# Patient Record
Sex: Male | Born: 1955 | Hispanic: Yes | Marital: Married | Smoking: Former smoker
Health system: Southern US, Community
[De-identification: ages and names within clinical notes are randomized; demographics above are authoritative.]

## PROBLEM LIST (undated history)

## (undated) DIAGNOSIS — M79671 Pain in right foot: Secondary | ICD-10-CM

## (undated) DIAGNOSIS — E559 Vitamin D deficiency, unspecified: Secondary | ICD-10-CM

## (undated) DIAGNOSIS — I639 Cerebral infarction, unspecified: Secondary | ICD-10-CM

## (undated) DIAGNOSIS — M25561 Pain in right knee: Secondary | ICD-10-CM

## (undated) DIAGNOSIS — R197 Diarrhea, unspecified: Secondary | ICD-10-CM

## (undated) DIAGNOSIS — N529 Male erectile dysfunction, unspecified: Secondary | ICD-10-CM

## (undated) DIAGNOSIS — I447 Left bundle-branch block, unspecified: Secondary | ICD-10-CM

## (undated) DIAGNOSIS — E291 Testicular hypofunction: Secondary | ICD-10-CM

## (undated) DIAGNOSIS — M1711 Unilateral primary osteoarthritis, right knee: Secondary | ICD-10-CM

## (undated) DIAGNOSIS — R0602 Shortness of breath: Secondary | ICD-10-CM

## (undated) DIAGNOSIS — E782 Mixed hyperlipidemia: Secondary | ICD-10-CM

## (undated) DIAGNOSIS — R931 Abnormal findings on diagnostic imaging of heart and coronary circulation: Secondary | ICD-10-CM

## (undated) DIAGNOSIS — R42 Dizziness and giddiness: Secondary | ICD-10-CM

## (undated) HISTORY — DX: Pain in right foot: M79.671

## (undated) HISTORY — DX: Vitamin D deficiency, unspecified: E55.9

## (undated) HISTORY — DX: Testicular hypofunction: E29.1

## (undated) HISTORY — DX: Diarrhea, unspecified: R19.7

## (undated) HISTORY — DX: Mixed hyperlipidemia: E78.2

## (undated) HISTORY — DX: Left bundle-branch block, unspecified: I44.7

## (undated) HISTORY — DX: Dizziness and giddiness: R42

## (undated) HISTORY — DX: Male erectile dysfunction, unspecified: N52.9

## (undated) HISTORY — DX: Pain in right knee: M25.561

## (undated) HISTORY — DX: Shortness of breath: R06.02

## (undated) HISTORY — DX: Cerebral infarction, unspecified: I63.9

## (undated) HISTORY — DX: Abnormal findings on diagnostic imaging of heart and coronary circulation: R93.1

## (undated) HISTORY — DX: Unilateral primary osteoarthritis, right knee: M17.11

---

## 2007-03-24 HISTORY — PX: OTHER SURGICAL HISTORY: SHX169

## 2007-03-24 HISTORY — PX: CARPAL TUNNEL RELEASE: SHX101

## 2017-03-23 HISTORY — PX: KIDNEY STONE SURGERY: SHX686

## 2019-06-01 DIAGNOSIS — S83231A Complex tear of medial meniscus, current injury, right knee, initial encounter: Secondary | ICD-10-CM

## 2019-06-07 DIAGNOSIS — E782 Mixed hyperlipidemia: Secondary | ICD-10-CM | POA: Insufficient documentation

## 2019-06-07 DIAGNOSIS — N529 Male erectile dysfunction, unspecified: Secondary | ICD-10-CM | POA: Insufficient documentation

## 2019-06-07 DIAGNOSIS — E291 Testicular hypofunction: Secondary | ICD-10-CM | POA: Insufficient documentation

## 2019-06-07 DIAGNOSIS — E559 Vitamin D deficiency, unspecified: Secondary | ICD-10-CM | POA: Insufficient documentation

## 2019-06-07 DIAGNOSIS — I447 Left bundle-branch block, unspecified: Secondary | ICD-10-CM | POA: Insufficient documentation

## 2019-06-07 DIAGNOSIS — R42 Dizziness and giddiness: Secondary | ICD-10-CM | POA: Insufficient documentation

## 2019-06-07 DIAGNOSIS — M79671 Pain in right foot: Secondary | ICD-10-CM | POA: Insufficient documentation

## 2019-06-07 DIAGNOSIS — M25561 Pain in right knee: Secondary | ICD-10-CM | POA: Insufficient documentation

## 2019-06-07 DIAGNOSIS — R197 Diarrhea, unspecified: Secondary | ICD-10-CM | POA: Insufficient documentation

## 2019-06-07 DIAGNOSIS — M1711 Unilateral primary osteoarthritis, right knee: Secondary | ICD-10-CM | POA: Insufficient documentation

## 2019-06-08 ENCOUNTER — Encounter: Payer: Self-pay | Admitting: Cardiology

## 2019-06-08 ENCOUNTER — Other Ambulatory Visit: Payer: Self-pay

## 2019-06-08 ENCOUNTER — Ambulatory Visit (INDEPENDENT_AMBULATORY_CARE_PROVIDER_SITE_OTHER): Payer: Commercial Managed Care - PPO | Admitting: Cardiology

## 2019-06-08 VITALS — BP 160/100 | HR 86 | Ht 61.0 in | Wt 218.0 lb

## 2019-06-08 DIAGNOSIS — I10 Essential (primary) hypertension: Secondary | ICD-10-CM

## 2019-06-08 DIAGNOSIS — I447 Left bundle-branch block, unspecified: Secondary | ICD-10-CM

## 2019-06-08 DIAGNOSIS — R9431 Abnormal electrocardiogram [ECG] [EKG]: Secondary | ICD-10-CM

## 2019-06-08 DIAGNOSIS — E782 Mixed hyperlipidemia: Secondary | ICD-10-CM | POA: Diagnosis not present

## 2019-06-08 HISTORY — DX: Essential (primary) hypertension: I10

## 2019-06-08 HISTORY — DX: Abnormal electrocardiogram (ECG) (EKG): R94.31

## 2019-06-08 HISTORY — DX: Left bundle-branch block, unspecified: I44.7

## 2019-06-08 HISTORY — DX: Morbid (severe) obesity due to excess calories: E66.01

## 2019-06-08 MED ORDER — HYDROCHLOROTHIAZIDE 12.5 MG PO CAPS: 12.5000 mg | ORAL_CAPSULE | Freq: Every day | ORAL | 1 refills | Status: DC

## 2019-06-08 NOTE — Progress Notes (Signed)
Cardiology Office Note:    Date:  06/08/2019   ID:  Austin Davenport, DOB 06/16/1955, MRN [id number]  PCP:  Hortencia Conradi, NP  Cardiologist:  Thomasene Ripple, DO  Electrophysiologist:  None   Referring MD: Hortencia Conradi, NP   Chief Complaint  Patient presents with  . Pre-op Exam    Rt knee repair    History of Present Illness:    Austin Davenport is a 64 y.o. male with a hx of hypertension, Hyperlipidemia, obesity presents to be evaluated  Preoperatively for left bundle bundle branch block. He denies chest pain, shortness of breath.   He was asked to see cardiology preoperatively.   Past Medical History:  Diagnosis Date  . Diarrhea   . Dizziness   . Erectile dysfunction   . Left bundle branch block   . Mixed hyperlipidemia   . Pain in right foot   . Pain in right knee   . Testicular hypofunction   . Unilateral primary osteoarthritis, right knee   . Vitamin D deficiency     Past Surgical History:  Procedure Laterality Date  . CARPAL TUNNEL RELEASE  2009  . KIDNEY STONE SURGERY  2019  . skin biopsy  2009    Current Medications: Current Meds  Medication Sig  . atorvastatin (LIPITOR) 40 MG tablet Take 1 tablet by mouth at bedtime.  . diclofenac Sodium (VOLTAREN) 1 % GEL Apply 1 application topically 2 (two) times daily.  . meclizine (ANTIVERT) 25 MG tablet Take 2 tablets by mouth daily as needed.  . meloxicam (MOBIC) 15 MG tablet Take 1 tablet by mouth daily.  . sildenafil (VIAGRA) 100 MG tablet Take 1 tablet by mouth daily as needed.  . terbinafine (LAMISIL) 1 % cream Apply 1 application topically daily.  Marland Kitchen terbinafine (LAMISIL) 250 MG tablet Take 1 tablet by mouth daily.  . Vitamin D, Ergocalciferol, (DRISDOL) 1.25 MG (50000 UNIT) CAPS capsule Take 1 capsule by mouth 2 (two) times a week.     Allergies:   Patient has no known allergies.   Social History   Socioeconomic History  . Marital status: Married    Spouse name: Not on file  . Number of children: Not on  file  . Years of education: Not on file  . Highest education level: Not on file  Occupational History  . Not on file  Tobacco Use  . Smoking status: Current Every Day Smoker    Types: Cigarettes  . Smokeless tobacco: Never Used  Substance and Sexual Activity  . Alcohol use: Never  . Drug use: Never  . Sexual activity: Not on file  Other Topics Concern  . Not on file  Social History Narrative  . Not on file   Social Determinants of Health   Financial Resource Strain:   . Difficulty of Paying Living Expenses:   Food Insecurity:   . Worried About Programme researcher, broadcasting/film/video in the Last Year:   . Barista in the Last Year:   Transportation Needs:   . Freight forwarder (Medical):   Marland Kitchen Lack of Transportation (Non-Medical):   Physical Activity:   . Days of Exercise per Week:   . Minutes of Exercise per Session:   Stress:   . Feeling of Stress :   Social Connections:   . Frequency of Communication with Friends and Family:   . Frequency of Social Gatherings with Friends and Family:   . Attends Religious Services:   . Active Member of  Clubs or Organizations:   . Attends Banker Meetings:   Marland Kitchen Marital Status:      Family History: The patient's family history includes Alcohol abuse in his father; Heart attack in his mother.  ROS:   Review of Systems  Constitution: Negative for decreased appetite, fever and weight gain.  HENT: Negative for congestion, ear discharge, hoarse voice and sore throat.   Eyes: Negative for discharge, redness, vision loss in right eye and visual halos.  Cardiovascular: Negative for chest pain, dyspnea on exertion, leg swelling, orthopnea and palpitations.  Respiratory: Negative for cough, hemoptysis, shortness of breath and snoring.   Endocrine: Negative for heat intolerance and polyphagia.  Hematologic/Lymphatic: Negative for bleeding problem. Does not bruise/bleed easily.  Skin: Negative for flushing, nail changes, rash and  suspicious lesions.  Musculoskeletal: Negative for arthritis, joint pain, muscle cramps, myalgias, neck pain and stiffness.  Gastrointestinal: Negative for abdominal pain, bowel incontinence, diarrhea and excessive appetite.  Genitourinary: Negative for decreased libido, genital sores and incomplete emptying.  Neurological: Negative for brief paralysis, focal weakness, headaches and loss of balance.  Psychiatric/Behavioral: Negative for altered mental status, depression and suicidal ideas.  Allergic/Immunologic: Negative for HIV exposure and persistent infections.    EKGs/Labs/Other Studies Reviewed:    The following studies were reviewed today:   EKG:  The ekg ordered today demonstrates sinus rhythm, Left bundle branch block HR 89 bpm.   Recent Labs:  No results found for requested labs within last 8760 hours.  Recent Lipid Panel No results found for: CHOL, TRIG, HDL, CHOLHDL, VLDL, LDLCALC, LDLDIRECT  Physical Exam:    VS:  BP (!) 160/100 (BP Location: Left Arm, Patient Position: Sitting, Cuff Size: Large)   Pulse 86   Ht 5\' 1"  (1.549 m)   Wt 218 lb (98.9 kg)   SpO2 99%   BMI 41.19 kg/m     Wt Readings from Last 3 Encounters:  06/08/19 218 lb (98.9 kg)     06/10/19,  Well nourished, well developed in no acute distress HEENT: Normal NECK: No JVD; No carotid bruits LYMPHATICS: No lymphadenopathy CARDIAC: S1S2 noted,RRR, no murmurs, rubs, gallops RESPIRATORY:  Clear to auscultation without rales, wheezing or rhonchi  ABDOMEN: Soft, non-tender, non-distended, +bowel sounds, no guarding. EXTREMITIES: No edema, No cyanosis, no clubbing MUSCULOSKELETAL:  No deformity  SKIN: Warm and dry NEUROLOGIC:  Alert and oriented x 3, non-focal PSYCHIATRIC:  Normal affect, good insight  ASSESSMENT:    1. LBBB (left bundle branch block)   2. Abnormal electrocardiogram   3. Essential hypertension   4. Mixed hyperlipidemia   5. Morbid obesity (HCC)    PLAN:    His procedure was  canceled due to his left bundle branch block.  The left bundle branch block was presumed to be new at the time of surgery, in addition given his risk factors hypertension, hyperlipidemia and premature coronary disease in his mother with her first MI in her 78s will pursue an ischemic evaluation.  I have educated patient about a pharmacologic nuclear stress test he is willing to proceed with this testing.  Hypertension-we will start the patient on hydrochlorothiazide 12.5 mg daily.  He does have bilateral leg edema which I am hoping will be able to resolve on his hydrochlorothiazide.  Hyperlipidemia continue patient his current Lipitor 40 mg daily.  Obesity -the patient understands the need to lose weight with diet and exercise. We have discussed specific strategies for this.  The patient is in agreement with the above plan.  The patient left the office in stable condition.  The patient will follow up in 1 month for blood pressure medication follow up.   Medication Adjustments/Labs and Tests Ordered: Current medicines are reviewed at length with the patient today.  Concerns regarding medicines are outlined above.  Orders Placed This Encounter  Procedures  . Basic metabolic panel  . Magnesium  . MYOCARDIAL PERFUSION IMAGING  . EKG 12-Lead   Meds ordered this encounter  Medications  . hydrochlorothiazide (MICROZIDE) 12.5 MG capsule    Sig: Take 1 capsule (12.5 mg total) by mouth daily.    Dispense:  90 capsule    Refill:  1    Patient Instructions  Medication Instructions:  Your physician has recommended you make the following change in your medication:   START: Hydrochlorothiazide 12.5 mg daily   *If you need a refill on your cardiac medications before your next appointment, please call your pharmacy*   Lab Work: Your physician recommends that you return for lab work today: bmp, mg  If you have labs (blood work) drawn today and your tests are completely normal, you will receive  your results only by: Marland Kitchen MyChart Message (if you have MyChart) OR . A paper copy in the mail If you have any lab test that is abnormal or we need to change your treatment, we will call you to review the results.   Testing/Procedures: Your physician has requested that you have a lexiscan myoview. For further information please visit [email or web address] Please follow instruction sheet, as given.     Follow-Up: At [facility], you and your health needs are our priority.  As part of our continuing mission to provide you with exceptional heart care, we have created designated Provider Care Teams.  These Care Teams include your primary Cardiologist (physician) and Advanced Practice Providers (APPs -  Physician Assistants and Nurse Practitioners) who all work together to provide you with the care you need, when you need it.  We recommend signing up for the patient portal called "MyChart".  Sign up information is provided on this After Visit Summary.  MyChart is used to connect with patients for Virtual Visits (Telemedicine).  Patients are able to view lab/test results, encounter notes, upcoming appointments, etc.  Non-urgent messages can be sent to your provider as well.   To learn more about what you can do with MyChart, go to [email or web address].    Your next appointment:   1 month(s)  The format for your next appointment:   In Person  Provider:   Thomasene Ripple, DO   Other Instructions   Cardiac Nuclear Scan A cardiac nuclear scan is a test that measures blood flow to the heart when a person is resting and when he or she is exercising. The test looks for problems such as:  Not enough blood reaching a portion of the heart.  The heart muscle not working normally. You may need this test if:  You have heart disease.  You have had abnormal lab results.  You have had heart surgery or a balloon procedure to open up blocked arteries (angioplasty).  You have chest pain.  You  have shortness of breath. In this test, a radioactive dye (tracer) is injected into your bloodstream. After the tracer has traveled to your heart, an imaging device is used to measure how much of the tracer is absorbed by or distributed to various areas of your heart. This procedure is usually done at a hospital and takes 2-4 hours. Tell a health  care provider about:  Any allergies you have.  All medicines you are taking, including vitamins, herbs, eye drops, creams, and over-the-counter medicines.  Any problems you or family members have had with anesthetic medicines.  Any blood disorders you have.  Any surgeries you have had.  Any medical conditions you have.  Whether you are pregnant or may be pregnant. What are the risks? Generally, this is a safe procedure. However, problems may occur, including:  Serious chest pain and heart attack. This is only a risk if the stress portion of the test is done.  Rapid heartbeat.  Sensation of warmth in your chest. This usually passes quickly.  Allergic reaction to the tracer. What happens before the procedure?  Ask your health care provider about changing or stopping your regular medicines. This is especially important if you are taking diabetes medicines or blood thinners.  Follow instructions from your health care provider about eating or drinking restrictions.  Remove your jewelry on the day of the procedure. What happens during the procedure?  An IV will be inserted into one of your veins.  Your health care provider will inject a small amount of radioactive tracer through the IV.  You will wait for 20-40 minutes while the tracer travels through your bloodstream.  Your heart activity will be monitored with an electrocardiogram (ECG).  You will lie down on an exam table.  Images of your heart will be taken for about 15-20 minutes.  You may also have a stress test. For this test, one of the following may be done: ? You will  exercise on a treadmill or stationary bike. While you exercise, your heart's activity will be monitored with an ECG, and your blood pressure will be checked. ? You will be given medicines that will increase blood flow to parts of your heart. This is done if you are unable to exercise.  When blood flow to your heart has peaked, a tracer will again be injected through the IV.  After 20-40 minutes, you will get back on the exam table and have more images taken of your heart.  Depending on the type of tracer used, scans may need to be repeated 3-4 hours later.  Your IV line will be removed when the procedure is over. The procedure may vary among health care providers and hospitals. What happens after the procedure?  Unless your health care provider tells you otherwise, you may return to your normal schedule, including diet, activities, and medicines.  Unless your health care provider tells you otherwise, you may increase your fluid intake. This will help to flush the contrast dye from your body. Drink enough fluid to keep your urine pale yellow.  Ask your health care provider, or the department that is doing the test: ? When will my results be ready? ? How will I get my results? Summary  A cardiac nuclear scan measures the blood flow to the heart when a person is resting and when he or she is exercising.  Tell your health care provider if you are pregnant.  Before the procedure, ask your health care provider about changing or stopping your regular medicines. This is especially important if you are taking diabetes medicines or blood thinners.  After the procedure, unless your health care provider tells you otherwise, increase your fluid intake. This will help flush the contrast dye from your body.  After the procedure, unless your health care provider tells you otherwise, you may return to your normal schedule, including diet,  activities, and medicines. This information is not intended to  replace advice given to you by your health care provider. Make sure you discuss any questions you have with your health care provider. Document Revised: 08/23/2017 Document Reviewed: 08/23/2017 Elsevier Patient Education  2020 [employer]      Adopting a Healthy Lifestyle.  Know what a healthy weight is for you (roughly BMI <25) and aim to maintain this   Aim for 7+ servings of fruits and vegetables daily   65-80+ fluid ounces of water or unsweet tea for healthy kidneys   Limit to max 1 drink of alcohol per day; avoid smoking/tobacco   Limit animal fats in diet for cholesterol and heart health - choose grass fed whenever available   Avoid highly processed foods, and foods high in saturated/trans fats   Aim for low stress - take time to unwind and care for your mental health   Aim for 150 min of moderate intensity exercise weekly for heart health, and weights twice weekly for bone health   Aim for 7-9 hours of sleep daily   When it comes to diets, agreement about the perfect plan isnt easy to find, even among the experts. Experts at the [employer] developed an idea known as the Healthy Eating Plate. Just imagine a plate divided into logical, healthy portions.   The emphasis is on diet quality:   Load up on vegetables and fruits - one-half of your plate: Aim for color and variety, and remember that potatoes dont count.   Go for whole grains - one-quarter of your plate: Whole wheat, barley, wheat berries, quinoa, oats, brown rice, and foods made with them. If you want pasta, go with whole wheat pasta.   Protein power - one-quarter of your plate: Fish, chicken, beans, and nuts are all healthy, versatile protein sources. Limit red meat.   The diet, however, does go beyond the plate, offering a few other suggestions.   Use healthy plant oils, such as olive, canola, soy, corn, sunflower and peanut. Check the labels, and avoid partially hydrogenated oil,  which have unhealthy trans fats.   If youre thirsty, drink water. Coffee and tea are good in moderation, but skip sugary drinks and limit milk and dairy products to one or two daily servings.   The type of carbohydrate in the diet is more important than the amount. Some sources of carbohydrates, such as vegetables, fruits, whole grains, and beans-are healthier than others.   Finally, stay active  Signed, Thomasene RippleKardie Michale Weikel, DO  06/08/2019 7:56 PM    [facility]

## 2019-06-08 NOTE — Patient Instructions (Signed)
Medication Instructions:  Your physician has recommended you make the following change in your medication:   START: Hydrochlorothiazide 12.5 mg daily   *If you need a refill on your cardiac medications before your next appointment, please call your pharmacy*   Lab Work: Your physician recommends that you return for lab work today: bmp, mg  If you have labs (blood work) drawn today and your tests are completely normal, you will receive your results only by: Marland Kitchen MyChart Message (if you have MyChart) OR . A paper copy in the mail If you have any lab test that is abnormal or we need to change your treatment, we will call you to review the results.   Testing/Procedures: Your physician has requested that you have a lexiscan myoview. For further information please visit [email or web address] Please follow instruction sheet, as given.     Follow-Up: At [facility], you and your health needs are our priority.  As part of our continuing mission to provide you with exceptional heart care, we have created designated Provider Care Teams.  These Care Teams include your primary Cardiologist (physician) and Advanced Practice Providers (APPs -  Physician Assistants and Nurse Practitioners) who all work together to provide you with the care you need, when you need it.  We recommend signing up for the patient portal called "MyChart".  Sign up information is provided on this After Visit Summary.  MyChart is used to connect with patients for Virtual Visits (Telemedicine).  Patients are able to view lab/test results, encounter notes, upcoming appointments, etc.  Non-urgent messages can be sent to your provider as well.   To learn more about what you can do with MyChart, go to [email or web address].    Your next appointment:   1 month(s)  The format for your next appointment:   In Person  Provider:   Thomasene Ripple, DO   Other Instructions   Cardiac Nuclear Scan A cardiac nuclear scan is a test  that measures blood flow to the heart when a person is resting and when he or she is exercising. The test looks for problems such as:  Not enough blood reaching a portion of the heart.  The heart muscle not working normally. You may need this test if:  You have heart disease.  You have had abnormal lab results.  You have had heart surgery or a balloon procedure to open up blocked arteries (angioplasty).  You have chest pain.  You have shortness of breath. In this test, a radioactive dye (tracer) is injected into your bloodstream. After the tracer has traveled to your heart, an imaging device is used to measure how much of the tracer is absorbed by or distributed to various areas of your heart. This procedure is usually done at a hospital and takes 2-4 hours. Tell a health care provider about:  Any allergies you have.  All medicines you are taking, including vitamins, herbs, eye drops, creams, and over-the-counter medicines.  Any problems you or family members have had with anesthetic medicines.  Any blood disorders you have.  Any surgeries you have had.  Any medical conditions you have.  Whether you are pregnant or may be pregnant. What are the risks? Generally, this is a safe procedure. However, problems may occur, including:  Serious chest pain and heart attack. This is only a risk if the stress portion of the test is done.  Rapid heartbeat.  Sensation of warmth in your chest. This usually passes quickly.  Allergic reaction to the tracer. What  happens before the procedure?  Ask your health care provider about changing or stopping your regular medicines. This is especially important if you are taking diabetes medicines or blood thinners.  Follow instructions from your health care provider about eating or drinking restrictions.  Remove your jewelry on the day of the procedure. What happens during the procedure?  An IV will be inserted into one of your veins.  Your  health care provider will inject a small amount of radioactive tracer through the IV.  You will wait for 20-40 minutes while the tracer travels through your bloodstream.  Your heart activity will be monitored with an electrocardiogram (ECG).  You will lie down on an exam table.  Images of your heart will be taken for about 15-20 minutes.  You may also have a stress test. For this test, one of the following may be done: ? You will exercise on a treadmill or stationary bike. While you exercise, your heart's activity will be monitored with an ECG, and your blood pressure will be checked. ? You will be given medicines that will increase blood flow to parts of your heart. This is done if you are unable to exercise.  When blood flow to your heart has peaked, a tracer will again be injected through the IV.  After 20-40 minutes, you will get back on the exam table and have more images taken of your heart.  Depending on the type of tracer used, scans may need to be repeated 3-4 hours later.  Your IV line will be removed when the procedure is over. The procedure may vary among health care providers and hospitals. What happens after the procedure?  Unless your health care provider tells you otherwise, you may return to your normal schedule, including diet, activities, and medicines.  Unless your health care provider tells you otherwise, you may increase your fluid intake. This will help to flush the contrast dye from your body. Drink enough fluid to keep your urine pale yellow.  Ask your health care provider, or the department that is doing the test: ? When will my results be ready? ? How will I get my results? Summary  A cardiac nuclear scan measures the blood flow to the heart when a person is resting and when he or she is exercising.  Tell your health care provider if you are pregnant.  Before the procedure, ask your health care provider about changing or stopping your regular medicines.  This is especially important if you are taking diabetes medicines or blood thinners.  After the procedure, unless your health care provider tells you otherwise, increase your fluid intake. This will help flush the contrast dye from your body.  After the procedure, unless your health care provider tells you otherwise, you may return to your normal schedule, including diet, activities, and medicines. This information is not intended to replace advice given to you by your health care provider. Make sure you discuss any questions you have with your health care provider. Document Revised: 08/23/2017 Document Reviewed: 08/23/2017 Elsevier Patient Education  [employer].

## 2019-06-09 ENCOUNTER — Telehealth: Payer: Self-pay | Admitting: Cardiology

## 2019-06-09 LAB — BASIC METABOLIC PANEL
BUN/Creatinine Ratio: 18 (ref 10–24)
BUN: 18 mg/dL (ref 8–27)
CO2: 24 mmol/L (ref 20–29)
Calcium: 9.7 mg/dL (ref 8.6–10.2)
Chloride: 102 mmol/L (ref 96–106)
Creatinine, Ser: 1.01 mg/dL (ref 0.76–1.27)
GFR calc Af Amer: 91 mL/min/{1.73_m2} (ref >59)
GFR calc non Af Amer: 79 mL/min/{1.73_m2} (ref >59)
Glucose: 102 mg/dL — ABNORMAL HIGH (ref 65–99)
Potassium: 4.5 mmol/L (ref 3.5–5.2)
Sodium: 143 mmol/L (ref 134–144)

## 2019-06-09 LAB — MAGNESIUM: Magnesium: 2 mg/dL (ref 1.6–2.3)

## 2019-06-09 NOTE — Telephone Encounter (Signed)
Reviewed lab results and plan to continue current medications with patient who verbalized understanding. He denies further questions and thanked me for the call.

## 2019-06-09 NOTE — Telephone Encounter (Signed)
New message ° ° °Patient is returning call for lab results. Please call. °

## 2019-06-12 ENCOUNTER — Encounter: Payer: Self-pay | Admitting: Cardiology

## 2019-06-22 ENCOUNTER — Telehealth: Payer: Self-pay | Admitting: *Deleted

## 2019-06-22 NOTE — Telephone Encounter (Signed)
Left message on voicemail per DPR in reference to upcoming appointment scheduled on 06/29/2019 at 0815 with detailed instructions given per Myocardial Perfusion Study Information Sheet for the test. LM to arrive 15 minutes early, and that it is imperative to arrive on time for appointment to keep from having the test rescheduled. If you need to cancel or reschedule your appointment, please call the office within 24 hours of your appointment. Failure to do so may result in a cancellation of your appointment, and a $50 no show fee. Phone number given for call back for any questions.   No mychart available.Austin Davenport, Austin Davenport

## 2019-06-29 ENCOUNTER — Other Ambulatory Visit: Payer: Self-pay

## 2019-06-29 ENCOUNTER — Ambulatory Visit (INDEPENDENT_AMBULATORY_CARE_PROVIDER_SITE_OTHER): Payer: Commercial Managed Care - PPO

## 2019-06-29 VITALS — Ht 61.0 in | Wt 218.0 lb

## 2019-06-29 DIAGNOSIS — R9431 Abnormal electrocardiogram [ECG] [EKG]: Secondary | ICD-10-CM

## 2019-06-29 MED ORDER — TECHNETIUM TC 99M TETROFOSMIN IV KIT
32.9000 | PACK | Freq: Once | INTRAVENOUS | Status: AC | PRN
  Administered 2019-06-29: 32.9 via INTRAVENOUS

## 2019-06-29 MED ORDER — REGADENOSON 0.4 MG/5ML IV SOLN
0.4000 mg | Freq: Once | INTRAVENOUS | Status: AC
  Administered 2019-06-29: 0.4 mg via INTRAVENOUS

## 2019-07-04 ENCOUNTER — Ambulatory Visit: Payer: Commercial Managed Care - PPO

## 2019-07-04 ENCOUNTER — Other Ambulatory Visit: Payer: Self-pay

## 2019-07-04 LAB — MYOCARDIAL PERFUSION IMAGING
LV dias vol: 106 mL (ref 62–150)
LV sys vol: 54 mL
Peak HR: 93 {beats}/min
Rest HR: 71 {beats}/min
SDS: 7
SRS: 12
SSS: 19
TID: 1.07

## 2019-07-04 MED ORDER — TECHNETIUM TC 99M TETROFOSMIN IV KIT
32.4000 | PACK | Freq: Once | INTRAVENOUS | Status: AC | PRN
  Administered 2019-07-04: 32.4 via INTRAVENOUS

## 2019-07-07 ENCOUNTER — Telehealth: Payer: Self-pay | Admitting: Cardiology

## 2019-07-07 NOTE — Telephone Encounter (Signed)
Patient called in error 

## 2019-07-12 ENCOUNTER — Encounter: Payer: Self-pay | Admitting: Cardiology

## 2019-07-12 ENCOUNTER — Other Ambulatory Visit: Payer: Self-pay

## 2019-07-12 ENCOUNTER — Ambulatory Visit (INDEPENDENT_AMBULATORY_CARE_PROVIDER_SITE_OTHER): Payer: Commercial Managed Care - PPO | Admitting: Cardiology

## 2019-07-12 VITALS — BP 138/88 | HR 89 | Ht 61.0 in | Wt 216.0 lb

## 2019-07-12 DIAGNOSIS — E782 Mixed hyperlipidemia: Secondary | ICD-10-CM

## 2019-07-12 DIAGNOSIS — R0989 Other specified symptoms and signs involving the circulatory and respiratory systems: Secondary | ICD-10-CM

## 2019-07-12 DIAGNOSIS — I10 Essential (primary) hypertension: Secondary | ICD-10-CM | POA: Diagnosis not present

## 2019-07-12 DIAGNOSIS — Z01810 Encounter for preprocedural cardiovascular examination: Secondary | ICD-10-CM

## 2019-07-12 HISTORY — DX: Other specified symptoms and signs involving the circulatory and respiratory systems: R09.89

## 2019-07-12 HISTORY — DX: Encounter for preprocedural cardiovascular examination: Z01.810

## 2019-07-12 NOTE — Patient Instructions (Signed)
Medication Instructions:  Your physician recommends that you continue on your current medications as directed. Please refer to the Current Medication list given to you today.  *If you need a refill on your cardiac medications before your next appointment, please call your pharmacy*   Lab Work: NONE. If you have labs (blood work) drawn today and your tests are completely normal, you will receive your results only by: . MyChart Message (if you have MyChart) OR . A paper copy in the mail If you have any lab test that is abnormal or we need to change your treatment, we will call you to review the results.   Testing/Procedures: NONE.   Follow-Up: At [facility], you and your health needs are our priority.  As part of our continuing mission to provide you with exceptional heart care, we have created designated Provider Care Teams.  These Care Teams include your primary Cardiologist (physician) and Advanced Practice Providers (APPs -  Physician Assistants and Nurse Practitioners) who all work together to provide you with the care you need, when you need it.  We recommend signing up for the patient portal called "MyChart".  Sign up information is provided on this After Visit Summary.  MyChart is used to connect with patients for Virtual Visits (Telemedicine).  Patients are able to view lab/test results, encounter notes, upcoming appointments, etc.  Non-urgent messages can be sent to your provider as well.   To learn more about what you can do with MyChart, go to [email or web address].    Your next appointment:   3 month(s)  The format for your next appointment:   In Person  Provider:   Kardie Tobb, DO   Other Instructions   

## 2019-07-12 NOTE — Progress Notes (Signed)
Cardiology Office Note:    Date:  07/12/2019   ID:  Austin Davenport, DOB 1955-09-26, MRN [id number]  PCP:  Hortencia Conradi, NP  Cardiologist:  Thomasene Ripple, DO  Electrophysiologist:  None   Referring MD: Hortencia Conradi, NP   Chief Complaint  Patient presents with  . Follow-up   History of Present Illness:    Austin Davenport is a 64 y.o. male with a hx of hypertension, Hyperlipidemia, obesity recently diagnosed left bundle branch block. The patient was found to have a LBBB preoperatively therefore he was asked to see cardiology. At his initial evaluation I recommended that the patient get a pharmacologic nuclear stress test prior to his surgery. He was able to get his test done and he is here to discuss his results. The patient is not fluent in english therefore phone interprete ID # [id number] was used.     Past Medical History:  Diagnosis Date  . Diarrhea   . Dizziness   . Erectile dysfunction   . Left bundle branch block   . Mixed hyperlipidemia   . Pain in right foot   . Pain in right knee   . Testicular hypofunction   . Unilateral primary osteoarthritis, right knee   . Vitamin D deficiency     Past Surgical History:  Procedure Laterality Date  . CARPAL TUNNEL RELEASE  2009  . KIDNEY STONE SURGERY  2019  . skin biopsy  2009    Current Medications: Current Meds  Medication Sig  . aspirin EC 81 MG tablet Take 81 mg by mouth daily.  Marland Kitchen atorvastatin (LIPITOR) 40 MG tablet Take 1 tablet by mouth at bedtime.  . diclofenac Sodium (VOLTAREN) 1 % GEL Apply 1 application topically 2 (two) times daily.  . hydrochlorothiazide (MICROZIDE) 12.5 MG capsule Take 1 capsule (12.5 mg total) by mouth daily.  . meclizine (ANTIVERT) 25 MG tablet Take 2 tablets by mouth daily as needed.  . Vitamin D, Ergocalciferol, (DRISDOL) 1.25 MG (50000 UNIT) CAPS capsule Take 1 capsule by mouth 2 (two) times a week.     Allergies:   Penicillins   Social History   Socioeconomic History  . Marital  status: Married    Spouse name: Not on file  . Number of children: Not on file  . Years of education: Not on file  . Highest education level: Not on file  Occupational History  . Not on file  Tobacco Use  . Smoking status: Current Every Day Smoker    Types: Cigarettes  . Smokeless tobacco: Never Used  Substance and Sexual Activity  . Alcohol use: Never  . Drug use: Never  . Sexual activity: Not on file  Other Topics Concern  . Not on file  Social History Narrative  . Not on file   Social Determinants of Health   Financial Resource Strain:   . Difficulty of Paying Living Expenses:   Food Insecurity:   . Worried About Programme researcher, broadcasting/film/video in the Last Year:   . Barista in the Last Year:   Transportation Needs:   . Freight forwarder (Medical):   Marland Kitchen Lack of Transportation (Non-Medical):   Physical Activity:   . Days of Exercise per Week:   . Minutes of Exercise per Session:   Stress:   . Feeling of Stress :   Social Connections:   . Frequency of Communication with Friends and Family:   . Frequency of Social Gatherings with Friends and Family:   .  Attends Religious Services:   . Active Member of Clubs or Organizations:   . Attends Archivist Meetings:   Marland Kitchen Marital Status:      Family History: The patient's family history includes Alcohol abuse in his father; Heart attack in his mother.  ROS:   Review of Systems  Constitution: Negative for decreased appetite, fever and weight gain.  HENT: Negative for congestion, ear discharge, hoarse voice and sore throat.   Eyes: Negative for discharge, redness, vision loss in right eye and visual halos.  Cardiovascular: Negative for chest pain, dyspnea on exertion, leg swelling, orthopnea and palpitations.  Respiratory: Negative for cough, hemoptysis, shortness of breath and snoring.   Endocrine: Negative for heat intolerance and polyphagia.  Hematologic/Lymphatic: Negative for bleeding problem. Does not  bruise/bleed easily.  Skin: Negative for flushing, nail changes, rash and suspicious lesions.  Musculoskeletal: Negative for arthritis, joint pain, muscle cramps, myalgias, neck pain and stiffness.  Gastrointestinal: Negative for abdominal pain, bowel incontinence, diarrhea and excessive appetite.  Genitourinary: Negative for decreased libido, genital sores and incomplete emptying.  Neurological: Negative for brief paralysis, focal weakness, headaches and loss of balance.  Psychiatric/Behavioral: Negative for altered mental status, depression and suicidal ideas.  Allergic/Immunologic: Negative for HIV exposure and persistent infections.    EKGs/Labs/Other Studies Reviewed:    The following studies were reviewed today:   EKG:  None today   Pharmacologic stress test  The left ventricular ejection fraction is mildly decreased (45-54%).  Nuclear stress EF: 49%.  Defect 1: There is a medium defect of moderate severity present in the basal inferoseptal and basal inferior location with hypokinesis in the basal inferior and basal inferoseptal walls.  Findings consistent with prior myocardial infarction with no evidence of peri-infarct ischemia.  This is an intermediate risk study, due to depressed ejection fraction.  Recent Labs: 06/08/2019: BUN 18; Creatinine, Ser 1.01; Magnesium 2.0; Potassium 4.5; Sodium 143  Recent Lipid Panel No results found for: CHOL, TRIG, HDL, CHOLHDL, VLDL, LDLCALC, LDLDIRECT  Physical Exam:    VS:  BP 138/88 (BP Location: Left Arm, Patient Position: Sitting, Cuff Size: Large)   Pulse 89   Ht 5\' 1"  (1.549 m)   Wt 216 lb (98 kg)   SpO2 98%   BMI 40.81 kg/m     Wt Readings from Last 3 Encounters:  07/12/19 216 lb (98 kg)  06/29/19 218 lb (98.9 kg)  06/08/19 218 lb (98.9 kg)     GEN: Well nourished, well developed in no acute distress HEENT: Normal NECK: No JVD; No carotid bruits LYMPHATICS: No lymphadenopathy CARDIAC: S1S2 noted,RRR, no murmurs,  rubs, gallops RESPIRATORY:  Clear to auscultation without rales, wheezing or rhonchi  ABDOMEN: Soft, non-tender, non-distended, +bowel sounds, no guarding. EXTREMITIES: No edema, No cyanosis, no clubbing MUSCULOSKELETAL:  No deformity  SKIN: Warm and dry NEUROLOGIC:  Alert and oriented x 3, non-focal PSYCHIATRIC:  Normal affect, good insight  ASSESSMENT:    1. Depressed left ventricular ejection fraction   2. Essential hypertension   3. Mixed hyperlipidemia   4. Morbid obesity ([place])   5. Pre-operative cardiovascular examination    PLAN:     1. His stress test showed prior myocardial infarction with no peri-infarct ischemia.  He does not have any symptoms of angina. For now will start aspirin and continue the patient on his lipitor. All of his questions were answered. I educated the patient on what it meant to have prior myocardial infarction noted on stress.  2. His EF is  mildly depressed on the stress test. I will get a TTE to check this. If it is then he will benefit from a low dose beta blocker.   3. Hyperlipidemia - continue Lipitor.   4. Morbid obesity - encouraged diet and exercise, but it is difficult for the patient to exercise due to his leg.   5. He can proceed with his noncardiac surgery. Please continue to monitor the patient during his procedure,  The patient is in agreement with the above plan. The patient left the office in stable condition.  The patient will follow up in 3 months or sooner if needed.   Medication Adjustments/Labs and Tests Ordered: Current medicines are reviewed at length with the patient today.  Concerns regarding medicines are outlined above.  No orders of the defined types were placed in this encounter.  No orders of the defined types were placed in this encounter.   Patient Instructions  Medication Instructions:  Your physician recommends that you continue on your current medications as directed. Please refer to the Current Medication  list given to you today.  *If you need a refill on your cardiac medications before your next appointment, please call your pharmacy*   Lab Work: NONE  If you have labs (blood work) drawn today and your tests are completely normal, you will receive your results only by: Marland Kitchen MyChart Message (if you have MyChart) OR . A paper copy in the mail If you have any lab test that is abnormal or we need to change your treatment, we will call you to review the results.   Testing/Procedures: NONE   Follow-Up: At [facility], you and your health needs are our priority.  As part of our continuing mission to provide you with exceptional heart care, we have created designated Provider Care Teams.  These Care Teams include your primary Cardiologist (physician) and Advanced Practice Providers (APPs -  Physician Assistants and Nurse Practitioners) who all work together to provide you with the care you need, when you need it.  We recommend signing up for the patient portal called "MyChart".  Sign up information is provided on this After Visit Summary.  MyChart is used to connect with patients for Virtual Visits (Telemedicine).  Patients are able to view lab/test results, encounter notes, upcoming appointments, etc.  Non-urgent messages can be sent to your provider as well.   To learn more about what you can do with MyChart, go to [email or web address].    Your next appointment:   3 month(s)  The format for your next appointment:   In Person  Provider:   Thomasene Ripple, DO   Other Instructions      Adopting a Healthy Lifestyle.  Know what a healthy weight is for you (roughly BMI <25) and aim to maintain this   Aim for 7+ servings of fruits and vegetables daily   65-80+ fluid ounces of water or unsweet tea for healthy kidneys   Limit to max 1 drink of alcohol per day; avoid smoking/tobacco   Limit animal fats in diet for cholesterol and heart health - choose grass fed whenever available     Avoid highly processed foods, and foods high in saturated/trans fats   Aim for low stress - take time to unwind and care for your mental health   Aim for 150 min of moderate intensity exercise weekly for heart health, and weights twice weekly for bone health   Aim for 7-9 hours of sleep daily   When it comes to  diets, agreement about the perfect plan isnt easy to find, even among the experts. Experts at the [facility] [employer] developed an idea known as the Healthy Eating Plate. Just imagine a plate divided into logical, healthy portions.   The emphasis is on diet quality:   Load up on vegetables and fruits - one-half of your plate: Aim for color and variety, and remember that potatoes dont count.   Go for whole grains - one-quarter of your plate: Whole wheat, barley, wheat berries, quinoa, oats, brown rice, and foods made with them. If you want pasta, go with whole wheat pasta.   Protein power - one-quarter of your plate: Fish, chicken, beans, and nuts are all healthy, versatile protein sources. Limit red meat.   The diet, however, does go beyond the plate, offering a few other suggestions.   Use healthy plant oils, such as olive, canola, soy, corn, sunflower and peanut. Check the labels, and avoid partially hydrogenated oil, which have unhealthy trans fats.   If youre thirsty, drink water. Coffee and tea are good in moderation, but skip sugary drinks and limit milk and dairy products to one or two daily servings.   The type of carbohydrate in the diet is more important than the amount. Some sources of carbohydrates, such as vegetables, fruits, whole grains, and beans-are healthier than others.   Finally, stay active  Signed, Thomasene Ripple, DO  07/12/2019 8:49 PM    [facility]

## 2019-10-19 ENCOUNTER — Ambulatory Visit: Payer: Commercial Managed Care - PPO | Admitting: Cardiology

## 2019-10-19 NOTE — Progress Notes (Deleted)
Cardiology Office Note:     Cardiology Office Note:    Date:  10/19/2019   ID:  Kimm Ungaro, DOB 01-24-1956, MRN [id number]  PCP:  Hortencia Conradi, NP  Cardiologist:  Thomasene Ripple, DO  Electrophysiologist:  None   Referring MD: Hortencia Conradi, NP   No chief complaint on file. ***  History of Present Illness:    Austin Davenport is a 64 y.o. male with a hx of hypertension, Hyperlipidemia, obesity recently diagnosed left bundle branch block , depressed ejection fraction I last saw the patient on July 12, 2019 at that time he was getting ready for noncardiac surgery.  The patient's stress test did not show any ischemia and therefore he was cleared for his procedure.  Past Medical History:  Diagnosis Date  . Diarrhea   . Dizziness   . Erectile dysfunction   . Left bundle branch block   . Mixed hyperlipidemia   . Pain in right foot   . Pain in right knee   . Testicular hypofunction   . Unilateral primary osteoarthritis, right knee   . Vitamin D deficiency     Past Surgical History:  Procedure Laterality Date  . CARPAL TUNNEL RELEASE  2009  . KIDNEY STONE SURGERY  2019  . skin biopsy  2009    Current Medications: No outpatient medications have been marked as taking for the 10/19/19 encounter (Appointment) with Thomasene Ripple, DO.     Allergies:   Penicillins   Social History   Socioeconomic History  . Marital status: Married    Spouse name: Not on file  . Number of children: Not on file  . Years of education: Not on file  . Highest education level: Not on file  Occupational History  . Not on file  Tobacco Use  . Smoking status: Current Every Day Smoker    Types: Cigarettes  . Smokeless tobacco: Never Used  Substance and Sexual Activity  . Alcohol use: Never  . Drug use: Never  . Sexual activity: Not on file  Other Topics Concern  . Not on file  Social History Narrative  . Not on file   Social Determinants of Health   Financial Resource Strain:   .  Difficulty of Paying Living Expenses:   Food Insecurity:   . Worried About Programme researcher, broadcasting/film/video in the Last Year:   . Barista in the Last Year:   Transportation Needs:   . Freight forwarder (Medical):   Marland Kitchen Lack of Transportation (Non-Medical):   Physical Activity:   . Days of Exercise per Week:   . Minutes of Exercise per Session:   Stress:   . Feeling of Stress :   Social Connections:   . Frequency of Communication with Friends and Family:   . Frequency of Social Gatherings with Friends and Family:   . Attends Religious Services:   . Active Member of Clubs or Organizations:   . Attends Banker Meetings:   Marland Kitchen Marital Status:      Family History: The patient's family history includes Alcohol abuse in his father; Heart attack in his mother.  ROS:   Review of Systems  Constitution: Negative for decreased appetite, fever and weight gain.  HENT: Negative for congestion, ear discharge, hoarse voice and sore throat.   Eyes: Negative for discharge, redness, vision loss in right eye and visual halos.  Cardiovascular: Negative for chest pain, dyspnea on exertion, leg swelling, orthopnea and palpitations.  Respiratory: Negative  for cough, hemoptysis, shortness of breath and snoring.   Endocrine: Negative for heat intolerance and polyphagia.  Hematologic/Lymphatic: Negative for bleeding problem. Does not bruise/bleed easily.  Skin: Negative for flushing, nail changes, rash and suspicious lesions.  Musculoskeletal: Negative for arthritis, joint pain, muscle cramps, myalgias, neck pain and stiffness.  Gastrointestinal: Negative for abdominal pain, bowel incontinence, diarrhea and excessive appetite.  Genitourinary: Negative for decreased libido, genital sores and incomplete emptying.  Neurological: Negative for brief paralysis, focal weakness, headaches and loss of balance.  Psychiatric/Behavioral: Negative for altered mental status, depression and suicidal ideas.    Allergic/Immunologic: Negative for HIV exposure and persistent infections.    EKGs/Labs/Other Studies Reviewed:    The following studies were reviewed today:   EKG:  The ekg ordered today demonstrates    Pharmacologic stress test  The left ventricular ejection fraction is mildly decreased (45-54%).  Nuclear stress EF: 49%.  Defect 1: There is a medium defect of moderate severity present in the basal inferoseptal and basal inferior location with hypokinesis in the basal inferior and basal inferoseptal walls.  Findings consistent with prior myocardial infarction with no evidence of peri-infarct ischemia.  This is an intermediate risk study, due to depressed ejection fraction.   Recent Labs: 06/08/2019: BUN 18; Creatinine, Ser 1.01; Magnesium 2.0; Potassium 4.5; Sodium 143  Recent Lipid Panel No results found for: CHOL, TRIG, HDL, CHOLHDL, VLDL, LDLCALC, LDLDIRECT  Physical Exam:    VS:  There were no vitals taken for this visit.    Wt Readings from Last 3 Encounters:  07/12/19 216 lb (98 kg)  06/29/19 218 lb (98.9 kg)  06/08/19 218 lb (98.9 kg)     GEN: Well nourished, well developed in no acute distress HEENT: Normal NECK: No JVD; No carotid bruits LYMPHATICS: No lymphadenopathy CARDIAC: S1S2 noted,RRR, no murmurs, rubs, gallops RESPIRATORY:  Clear to auscultation without rales, wheezing or rhonchi  ABDOMEN: Soft, non-tender, non-distended, +bowel sounds, no guarding. EXTREMITIES: No edema, No cyanosis, no clubbing MUSCULOSKELETAL:  No deformity  SKIN: Warm and dry NEUROLOGIC:  Alert and oriented x 3, non-focal PSYCHIATRIC:  Normal affect, good insight  ASSESSMENT:    No diagnosis found. PLAN:     1.  The patient is in agreement with the above plan. The patient left the office in stable condition.  The patient will follow up in   Medication Adjustments/Labs and Tests Ordered: Current medicines are reviewed at length with the patient today.  Concerns  regarding medicines are outlined above.  No orders of the defined types were placed in this encounter.  No orders of the defined types were placed in this encounter.   There are no Patient Instructions on file for this visit.   Adopting a Healthy Lifestyle.  Know what a healthy weight is for you (roughly BMI <25) and aim to maintain this   Aim for 7+ servings of fruits and vegetables daily   65-80+ fluid ounces of water or unsweet tea for healthy kidneys   Limit to max 1 drink of alcohol per day; avoid smoking/tobacco   Limit animal fats in diet for cholesterol and heart health - choose grass fed whenever available   Avoid highly processed foods, and foods high in saturated/trans fats   Aim for low stress - take time to unwind and care for your mental health   Aim for 150 min of moderate intensity exercise weekly for heart health, and weights twice weekly for bone health   Aim for 7-9 hours of sleep  daily   When it comes to diets, agreement about the perfect plan isnt easy to find, even among the experts. Experts at the [facility] [employer] developed an idea known as the Healthy Eating Plate. Just imagine a plate divided into logical, healthy portions.   The emphasis is on diet quality:   Load up on vegetables and fruits - one-half of your plate: Aim for color and variety, and remember that potatoes dont count.   Go for whole grains - one-quarter of your plate: Whole wheat, barley, wheat berries, quinoa, oats, brown rice, and foods made with them. If you want pasta, go with whole wheat pasta.   Protein power - one-quarter of your plate: Fish, chicken, beans, and nuts are all healthy, versatile protein sources. Limit red meat.   The diet, however, does go beyond the plate, offering a few other suggestions.   Use healthy plant oils, such as olive, canola, soy, corn, sunflower and peanut. Check the labels, and avoid partially hydrogenated oil, which have unhealthy  trans fats.   If youre thirsty, drink water. Coffee and tea are good in moderation, but skip sugary drinks and limit milk and dairy products to one or two daily servings.   The type of carbohydrate in the diet is more important than the amount. Some sources of carbohydrates, such as vegetables, fruits, whole grains, and beans-are healthier than others.   Finally, stay active  Signed, Thomasene Ripple, DO  10/19/2019 10:27 AM    [facility]

## 2019-11-16 ENCOUNTER — Encounter: Payer: Self-pay | Admitting: Cardiology

## 2019-11-16 DIAGNOSIS — R55 Syncope and collapse: Secondary | ICD-10-CM

## 2019-11-16 DIAGNOSIS — I119 Hypertensive heart disease without heart failure: Secondary | ICD-10-CM

## 2019-11-16 DIAGNOSIS — R0989 Other specified symptoms and signs involving the circulatory and respiratory systems: Secondary | ICD-10-CM

## 2019-11-20 ENCOUNTER — Other Ambulatory Visit: Payer: Self-pay

## 2019-11-20 ENCOUNTER — Encounter: Payer: Self-pay | Admitting: *Deleted

## 2019-11-20 ENCOUNTER — Ambulatory Visit (INDEPENDENT_AMBULATORY_CARE_PROVIDER_SITE_OTHER): Payer: Commercial Managed Care - PPO

## 2019-11-20 ENCOUNTER — Ambulatory Visit (INDEPENDENT_AMBULATORY_CARE_PROVIDER_SITE_OTHER): Payer: Commercial Managed Care - PPO | Admitting: Cardiology

## 2019-11-20 ENCOUNTER — Encounter: Payer: Self-pay | Admitting: Cardiology

## 2019-11-20 VITALS — BP 154/88 | HR 80 | Ht 61.0 in | Wt 219.8 lb

## 2019-11-20 DIAGNOSIS — R42 Dizziness and giddiness: Secondary | ICD-10-CM | POA: Diagnosis not present

## 2019-11-20 DIAGNOSIS — I10 Essential (primary) hypertension: Secondary | ICD-10-CM | POA: Diagnosis not present

## 2019-11-20 DIAGNOSIS — I447 Left bundle-branch block, unspecified: Secondary | ICD-10-CM

## 2019-11-20 DIAGNOSIS — R0989 Other specified symptoms and signs involving the circulatory and respiratory systems: Secondary | ICD-10-CM

## 2019-11-20 NOTE — Patient Instructions (Addendum)
Medication Instructions:    Your physician recommends that you continue on your current medications as directed. Please refer to the Current Medication list given to you today.  *If you need a refill on your cardiac medications before your next appointment, please call your pharmacy*   Lab Work: NONE ORDERED  TODAY   If you have labs (blood work) drawn today and your tests are completely normal, you will receive your results only by: Marland Kitchen MyChart Message (if you have MyChart) OR . A paper copy in the mail If you have any lab test that is abnormal or we need to change your treatment, we will call you to review the results.   Testing/Procedures: Your physician has recommended that you wear an event monitor. Event monitors are medical devices that record the heart's electrical activity. Doctors most often [place] these monitors to diagnose arrhythmias. Arrhythmias are problems with the speed or rhythm of the heartbeat. The monitor is a small, portable device. You can wear one while you do your normal daily activities. This is usually used to diagnose what is causing palpitations/syncope (passing out).   Follow-Up: At [facility], you and your health needs are our priority.  As part of our continuing mission to provide you with exceptional heart care, we have created designated Provider Care Teams.  These Care Teams include your primary Cardiologist (physician) and Advanced Practice Providers (APPs -  Physician Assistants and Nurse Practitioners) who all work together to provide you with the care you need, when you need it.  We recommend signing up for the patient portal called "MyChart".  Sign up information is provided on this After Visit Summary.  MyChart is used to connect with patients for Virtual Visits (Telemedicine).  Patients are able to view lab/test results, encounter notes, upcoming appointments, etc.  Non-urgent messages can be sent to your provider as well.   To learn more about what you  can do with MyChart, go to [email or web address].    Your next appointment:   4 week(s)  The format for your next appointment:   In Person  Provider:   You will see Thomasene Ripple, DO.  Or, you can be scheduled with the following Advanced Practice Provider on your designated Care Team (at our [facility]):  Gillian Shields, FNP    Other Instructions   ALSO HAVE BEEN REFERRED TO (ENT) FOR DIZZINESS  YOU HAVE BEEN RECCOMMENDED TO FOLLOW UP WITH PRIMARY CARE DOCTOR ALSO AS SOON AS POSSIBLE    ZIO XT- Long Term Monitor Instructions   Your physician has requested you wear your ZIO patch monitor__7_____days.

## 2019-11-20 NOTE — Progress Notes (Signed)
Cardiology Office Note:    Date:  11/20/2019   ID:  Austin Davenport, DOB January 20, 1956, MRN [id number]  PCP:  Hortencia Conradi, NP  Cardiologist:  Thomasene Ripple, DO  Electrophysiologist:  None   Referring MD: Hortencia Conradi, NP     History of Present Illness:    Austin Davenport is a 64 y.o. male with a hx of hypertension, left bundle branch block, hyperlipidemia and depressed ejection fraction.  Did see the patient on July 12, 2019 at that time we discussed his stress test which showed depressed ejection fraction.  Also did asked the patient to get an echocardiogram but he never followed for this.  In previous visits his blood pressure was elevated and I had started him on hydrochlorothiazide.  In the interim the patient presented to the [facility] after he had a syncope episode.  During his hospitalization he had a chest x-ray which did not show any active disease.  He also had an echocardiogram which showed his LVEF to be normal at 50%.  In addition he had evidence of diastolic dysfunction trace mitral regurgitation trace tricuspid regurgitation were present.  He also had an EEG while he was admitted which also was reported to be normal.  The patient tells me today that he has had persistent dizziness since he left the hospital.  He was started on carvedilol for elevated blood pressure which she tells me made his dizziness significantly worse.  He is now using a cane to walk to help him with his balance due to this issue.  No other complaints at this time.   Past Medical History:  Diagnosis Date  . Diarrhea   . Dizziness   . Erectile dysfunction   . Left bundle branch block   . Mixed hyperlipidemia   . Pain in right foot   . Pain in right knee   . Testicular hypofunction   . Unilateral primary osteoarthritis, right knee   . Vitamin D deficiency     Past Surgical History:  Procedure Laterality Date  . CARPAL TUNNEL RELEASE  2009  . KIDNEY STONE SURGERY  2019  . skin  biopsy  2009    Current Medications: Current Meds  Medication Sig  . albuterol (VENTOLIN HFA) 108 (90 Base) MCG/ACT inhaler Inhale 1 puff into the lungs daily.  Marland Kitchen aspirin EC 81 MG tablet Take 81 mg by mouth daily.  Marland Kitchen atorvastatin (LIPITOR) 40 MG tablet Take 1 tablet by mouth at bedtime.  . Azelastine-Fluticasone 137-50 MCG/ACT SUSP Place 1 spray into both nostrils daily.  . diclofenac Sodium (VOLTAREN) 1 % GEL Apply 1 application topically 2 (two) times daily.  . furosemide (LASIX) 40 MG tablet Take 40 mg by mouth daily as needed.  . meclizine (ANTIVERT) 25 MG tablet Take 2 tablets by mouth daily as needed.  . meloxicam (MOBIC) 15 MG tablet Take 15 mg by mouth daily.  . metoprolol tartrate (LOPRESSOR) 25 MG tablet Take 25 mg by mouth 2 (two) times daily.  . Vitamin D, Ergocalciferol, (DRISDOL) 1.25 MG (50000 UNIT) CAPS capsule Take 1 capsule by mouth 2 (two) times a week.     Allergies:   Penicillins   Social History   Socioeconomic History  . Marital status: Married    Spouse name: Not on file  . Number of children: Not on file  . Years of education: Not on file  . Highest education level: Not on file  Occupational History  . Not on file  Tobacco  Use  . Smoking status: Current Every Day Smoker    Types: Cigarettes  . Smokeless tobacco: Never Used  Substance and Sexual Activity  . Alcohol use: Never  . Drug use: Never  . Sexual activity: Not on file  Other Topics Concern  . Not on file  Social History Narrative  . Not on file   Social Determinants of Health   Financial Resource Strain:   . Difficulty of Paying Living Expenses: Not on file  Food Insecurity:   . Worried About Programme researcher, broadcasting/film/video in the Last Year: Not on file  . Ran Out of Food in the Last Year: Not on file  Transportation Needs:   . Lack of Transportation (Medical): Not on file  . Lack of Transportation (Non-Medical): Not on file  Physical Activity:   . Days of Exercise per Week: Not on file  .  Minutes of Exercise per Session: Not on file  Stress:   . Feeling of Stress : Not on file  Social Connections:   . Frequency of Communication with Friends and Family: Not on file  . Frequency of Social Gatherings with Friends and Family: Not on file  . Attends Religious Services: Not on file  . Active Member of Clubs or Organizations: Not on file  . Attends Banker Meetings: Not on file  . Marital Status: Not on file     Family History: The patient's family history includes Alcohol abuse in his father; Heart attack in his mother.  ROS:   Review of Systems  Constitution: Reports dizziness.  Negative for decreased appetite, fever and weight gain.  HENT: Negative for congestion, ear discharge, hoarse voice and sore throat.   Eyes: Negative for discharge, redness, vision loss in right eye and visual halos.  Cardiovascular: Negative for chest pain, dyspnea on exertion, leg swelling, orthopnea and palpitations.  Respiratory: Negative for cough, hemoptysis, shortness of breath and snoring.   Endocrine: Negative for heat intolerance and polyphagia.  Hematologic/Lymphatic: Negative for bleeding problem. Does not bruise/bleed easily.  Skin: Negative for flushing, nail changes, rash and suspicious lesions.  Musculoskeletal: Negative for arthritis, joint pain, muscle cramps, myalgias, neck pain and stiffness.  Gastrointestinal: Negative for abdominal pain, bowel incontinence, diarrhea and excessive appetite.  Genitourinary: Negative for decreased libido, genital sores and incomplete emptying.  Neurological: Negative for brief paralysis, focal weakness, headaches and loss of balance.  Psychiatric/Behavioral: Negative for altered mental status, depression and suicidal ideas.  Allergic/Immunologic: Negative for HIV exposure and persistent infections.    EKGs/Labs/Other Studies Reviewed:    The following studies were reviewed today:   EKG: None today  Echocardiogram done at the  [facility] shows EF 50%.  Grade 1 diastolic dysfunction.  Trace mitral regurgitation.  Trace tricuspid regurgitation.  Right ventricle is normal in size and function.  Left atrium is normal in size.  There is normal in size.  Tricuspid valve is trileaflet and structurally normal.   Pharmacologic stress test  The left ventricular ejection fraction is mildly decreased (45-54%).  Nuclear stress EF: 49%.  Defect 1: There is a medium defect of moderate severity present in the basal inferoseptal and basal inferior location with hypokinesis in the basal inferior and basal inferoseptal walls.  Findings consistent with prior myocardial infarction with no evidence of peri-infarct ischemia.  This is an intermediate risk study, due to depressed ejection fraction.   Recent Labs: 06/08/2019: BUN 18; Creatinine, Ser 1.01; Magnesium 2.0; Potassium 4.5; Sodium 143  Recent Lipid  Panel No results found for: CHOL, TRIG, HDL, CHOLHDL, VLDL, LDLCALC, LDLDIRECT  Physical Exam:    VS:  BP (!) 154/88   Pulse 80   Ht 5\' 1"  (1.549 m)   Wt 219 lb 12.8 oz (99.7 kg)   SpO2 98%   BMI 41.53 kg/m     Wt Readings from Last 3 Encounters:  11/20/19 219 lb 12.8 oz (99.7 kg)  07/12/19 216 lb (98 kg)  06/29/19 218 lb (98.9 kg)     GEN: Well nourished, well developed in no acute distress HEENT: Normal NECK: No JVD; No carotid bruits LYMPHATICS: No lymphadenopathy CARDIAC: S1S2 noted,RRR, no murmurs, rubs, gallops RESPIRATORY:  Clear to auscultation without rales, wheezing or rhonchi  ABDOMEN: Soft, non-tender, non-distended, +bowel sounds, no guarding. EXTREMITIES: No edema, No cyanosis, no clubbing MUSCULOSKELETAL:  No deformity  SKIN: Warm and dry NEUROLOGIC:  Alert and oriented x 3, non-focal PSYCHIATRIC:  Normal affect, good insight  ASSESSMENT:    1. Dizziness   2. Left bundle branch block   3. Essential hypertension   4. Morbid obesity (HCC)   5. Depressed left ventricular ejection  fraction    PLAN:     1.  He still is complaining of dizziness.  I would like to rule out a cardiovascular etiology of this dizziness, therefore at this time I would like to placed a zio patch for   7 days. For now, I do reccomend that the patient goes to the nearest ED if  symptoms recur.  I also reviewed his echocardiogram from [facility] which were read by me these results again was shared with the patient.  I have also asked the patient to see ENT to rule out inner ear dysfunction.  2.  He is hypertensive in the office but given the fact that he does tell me he is significantly dizzy and on his carvedilol he also was dizzy I like to hold off on adding any additional medication at this time and keep him on his hydrochlorothiazide 12.5 mg daily.  3.  The patient understands the need to lose weight with diet and exercise. We have discussed specific strategies for this.  Given his persistent symptoms as well as the patient cannot tolerate balancing without a cane and tells me even at work he feels like he is going to pass out.  We will be given the patient a note to stay out of work until his next visit.  The patient is in agreement with the above plan. The patient left the office in stable condition.  The patient will follow up in 4 weeks or sooner if needed.   Medication Adjustments/Labs and Tests Ordered: Current medicines are reviewed at length with the patient today.  Concerns regarding medicines are outlined above.  Orders Placed This Encounter  Procedures  . Ambulatory referral to ENT  . LONG TERM MONITOR (3-14 DAYS)   No orders of the defined types were placed in this encounter.   Patient Instructions  Medication Instructions:    Your physician recommends that you continue on your current medications as directed. Please refer to the Current Medication list given to you today.  *If you need a refill on your cardiac medications before your next appointment, please call  your pharmacy*   Lab Work: NONE ORDERED  TODAY   If you have labs (blood work) drawn today and your tests are completely normal, you will receive your results only by: [facility] MyChart Message (if you have MyChart) OR .  A paper copy in the mail If you have any lab test that is abnormal or we need to change your treatment, we will call you to review the results.   Testing/Procedures: Your physician has recommended that you wear an event monitor. Event monitors are medical devices that record the heart's electrical activity. Doctors most often [place] these monitors to diagnose arrhythmias. Arrhythmias are problems with the speed or rhythm of the heartbeat. The monitor is a small, portable device. You can wear one while you do your normal daily activities. This is usually used to diagnose what is causing palpitations/syncope (passing out).   Follow-Up: At [facility], you and your health needs are our priority.  As part of our continuing mission to provide you with exceptional heart care, we have created designated Provider Care Teams.  These Care Teams include your primary Cardiologist (physician) and Advanced Practice Providers (APPs -  Physician Assistants and Nurse Practitioners) who all work together to provide you with the care you need, when you need it.  We recommend signing up for the patient portal called "MyChart".  Sign up information is provided on this After Visit Summary.  MyChart is used to connect with patients for Virtual Visits (Telemedicine).  Patients are able to view lab/test results, encounter notes, upcoming appointments, etc.  Non-urgent messages can be sent to your provider as well.   To learn more about what you can do with MyChart, go to [email or web address].    Your next appointment:   4 week(s)  The format for your next appointment:   In Person  Provider:   You will see Thomasene Ripple, DO.  Or, you can be scheduled with the following Advanced Practice Provider on your  designated Care Team (at our [facility]):  Gillian Shields, FNP    Other Instructions   ALSO HAVE BEEN REFERRED TO (ENT) FOR DIZZINESS  YOU HAVE BEEN RECCOMMENDED TO FOLLOW UP WITH PRIMARY CARE DOCTOR ALSO AS SOON AS POSSIBLE     Adopting a Healthy Lifestyle.  Know what a healthy weight is for you (roughly BMI <25) and aim to maintain this   Aim for 7+ servings of fruits and vegetables daily   65-80+ fluid ounces of water or unsweet tea for healthy kidneys   Limit to max 1 drink of alcohol per day; avoid smoking/tobacco   Limit animal fats in diet for cholesterol and heart health - choose grass fed whenever available   Avoid highly processed foods, and foods high in saturated/trans fats   Aim for low stress - take time to unwind and care for your mental health   Aim for 150 min of moderate intensity exercise weekly for heart health, and weights twice weekly for bone health   Aim for 7-9 hours of sleep daily   When it comes to diets, agreement about the perfect plan isnt easy to find, even among the experts. Experts at the [facility] of [employer] developed an idea known as the Healthy Eating Plate. Just imagine a plate divided into logical, healthy portions.   The emphasis is on diet quality:   Load up on vegetables and fruits - one-half of your plate: Aim for color and variety, and remember that potatoes dont count.   Go for whole grains - one-quarter of your plate: Whole wheat, barley, wheat berries, quinoa, oats, brown rice, and foods made with them. If you want pasta, go with whole wheat pasta.   Protein power - one-quarter of your plate: Fish,  chicken, beans, and nuts are all healthy, versatile protein sources. Limit red meat.   The diet, however, does go beyond the plate, offering a few other suggestions.   Use healthy plant oils, such as olive, canola, soy, corn, sunflower and peanut. Check the labels, and avoid partially hydrogenated oil, which  have unhealthy trans fats.   If youre thirsty, drink water. Coffee and tea are good in moderation, but skip sugary drinks and limit milk and dairy products to one or two daily servings.   The type of carbohydrate in the diet is more important than the amount. Some sources of carbohydrates, such as vegetables, fruits, whole grains, and beans-are healthier than others.   Finally, stay active  Signed, Thomasene RippleKardie Levaughn Puccinelli, DO  11/20/2019 9:03 AM    [facility]

## 2019-11-24 ENCOUNTER — Other Ambulatory Visit: Payer: Self-pay | Admitting: Cardiology

## 2019-12-01 ENCOUNTER — Telehealth: Payer: Self-pay | Admitting: Cardiology

## 2019-12-01 NOTE — Telephone Encounter (Signed)
Patient states he dropped off paperwork about going back to work 1 week ago and has not heard anything.

## 2019-12-04 NOTE — Telephone Encounter (Signed)
Spoke to the patient just now and he let me know that he needs a note to go back to work. He states that he was told by Dr. Servando Salina that he will need to be seen on the 27th before returning to work. I told him that since this was the case we could just get the note for him when he comes in for his appointment as long as he is cleared. He verbalizes understanding and thanks me for the call back.

## 2019-12-04 NOTE — Telephone Encounter (Signed)
No I have not seen that paperwork.

## 2019-12-15 ENCOUNTER — Other Ambulatory Visit: Payer: Self-pay

## 2019-12-18 ENCOUNTER — Ambulatory Visit (INDEPENDENT_AMBULATORY_CARE_PROVIDER_SITE_OTHER): Payer: Commercial Managed Care - PPO | Admitting: Cardiology

## 2019-12-18 ENCOUNTER — Other Ambulatory Visit: Payer: Self-pay

## 2019-12-18 ENCOUNTER — Encounter: Payer: Self-pay | Admitting: Cardiology

## 2019-12-18 ENCOUNTER — Other Ambulatory Visit (HOSPITAL_COMMUNITY)
Admission: RE | Admit: 2019-12-18 | Discharge: 2019-12-18 | Disposition: A | Discharge status: Home / Self Care | Payer: Commercial Managed Care - PPO | Source: Ambulatory Visit | Attending: Orthopedic Surgery | Admitting: Orthopedic Surgery

## 2019-12-18 ENCOUNTER — Encounter: Payer: Self-pay | Admitting: Neurology

## 2019-12-18 VITALS — BP 152/84 | HR 72 | Ht 63.0 in | Wt 233.0 lb

## 2019-12-18 DIAGNOSIS — I209 Angina pectoris, unspecified: Secondary | ICD-10-CM | POA: Diagnosis not present

## 2019-12-18 DIAGNOSIS — I10 Essential (primary) hypertension: Secondary | ICD-10-CM

## 2019-12-18 DIAGNOSIS — E782 Mixed hyperlipidemia: Secondary | ICD-10-CM

## 2019-12-18 DIAGNOSIS — R0602 Shortness of breath: Secondary | ICD-10-CM

## 2019-12-18 DIAGNOSIS — R42 Dizziness and giddiness: Secondary | ICD-10-CM | POA: Diagnosis not present

## 2019-12-18 DIAGNOSIS — F0781 Postconcussional syndrome: Secondary | ICD-10-CM

## 2019-12-18 NOTE — H&P (View-Only) (Signed)
Cardiology Office Note:    Date:  12/18/2019   ID:  Austin Davenport, DOB 07-Jan-1956, MRN [id number]  PCP:  Hortencia Conradi, NP  Cardiologist:  Thomasene Ripple, DO  Electrophysiologist:  None   Referring MD: Hortencia Conradi, NP   Follow up  History of Present Illness:    Austin Davenport is a 64 y.o. male with a hx of hypertension, left bundle branch block, hyperlipidemia and depressed ejection fraction.  Did see the patient on July 12, 2019 at that time we discussed his stress test which showed depressed ejection fraction.  Also did asked the patient to get an echocardiogram but he never followed for this.  In previous visits his blood pressure was elevated and I had started him on hydrochlorothiazide.  In the interim the patient presented to the [facility] after he had a syncope episode.  During his hospitalization he had a chest x-ray which did not show any active disease.  He also had an echocardiogram which showed his LVEF to be normal at 50%.  In addition he had evidence of diastolic dysfunction trace mitral regurgitation trace tricuspid regurgitation were present.  He also had an EEG while he was admitted which also was reported to be normal.   I saw the patient 1 November 20, 2019 at that time he told me that his dizziness was significant.  I placed a monitor on the patient which he wore in the interim.  However today he is very worried as he has been experiencing significant shortness of breath on exertion and now notes that this is occurring at rest also.  He says he is not doing much but when he is sick now he is short of breath and is worsened when he is walking around.  This is bothersome.  At times he gets intermittent chest pain.    Past Medical History:  Diagnosis Date  . Abnormal electrocardiogram 06/08/2019  . Depressed left ventricular ejection fraction 07/12/2019  . Diarrhea   . Dizziness   . Erectile dysfunction   . Essential hypertension 06/08/2019  . LBBB  (left bundle branch block) 06/08/2019  . Left bundle branch block   . Mixed hyperlipidemia   . Morbid obesity (HCC) 06/08/2019  . Pain in right foot   . Pain in right knee   . Pre-operative cardiovascular examination 07/12/2019  . Testicular hypofunction   . Unilateral primary osteoarthritis, right knee   . Vitamin D deficiency     Past Surgical History:  Procedure Laterality Date  . CARPAL TUNNEL RELEASE  2009  . KIDNEY STONE SURGERY  2019  . skin biopsy  2009    Current Medications: Current Meds  Medication Sig  . albuterol (VENTOLIN HFA) 108 (90 Base) MCG/ACT inhaler Inhale 1 puff into the lungs every 6 (six) hours as needed for wheezing.   Marland Kitchen aspirin EC 81 MG tablet Take 81 mg by mouth daily.  Marland Kitchen atorvastatin (LIPITOR) 40 MG tablet Take 40 mg by mouth at bedtime.   . Azelastine-Fluticasone 137-50 MCG/ACT SUSP Place 1 spray into both nostrils daily.  . diclofenac Sodium (VOLTAREN) 1 % GEL Apply 1 application topically 2 (two) times daily as needed (pain).   . furosemide (LASIX) 40 MG tablet Take 40 mg by mouth daily.   . hydrochlorothiazide (MICROZIDE) 12.5 MG capsule TAKE 1 CAPSULE BY MOUTH EVERY DAY (Patient taking differently: Take 12.5 mg by mouth daily. )  . meclizine (ANTIVERT) 25 MG tablet Take 50 mg by mouth daily.   Marland Kitchen  meloxicam (MOBIC) 15 MG tablet Take 15 mg by mouth daily.  . metoprolol tartrate (LOPRESSOR) 25 MG tablet Take 25 mg by mouth 2 (two) times daily.  . traMADol (ULTRAM) 50 MG tablet Take 50 mg by mouth every 6 (six) hours as needed for moderate pain.   . Vitamin D, Ergocalciferol, (DRISDOL) 1.25 MG (50000 UNIT) CAPS capsule Take 50,000 Units by mouth 2 (two) times a week. Mondays and Fridays     Allergies:   Penicillins   Social History   Socioeconomic History  . Marital status: Married    Spouse name: Not on file  . Number of children: Not on file  . Years of education: Not on file  . Highest education level: Not on file  Occupational History  . Not  on file  Tobacco Use  . Smoking status: Current Every Day Smoker    Types: Cigarettes  . Smokeless tobacco: Never Used  Substance and Sexual Activity  . Alcohol use: Never  . Drug use: Never  . Sexual activity: Not on file  Other Topics Concern  . Not on file  Social History Narrative  . Not on file   Social Determinants of Health   Financial Resource Strain:   . Difficulty of Paying Living Expenses: Not on file  Food Insecurity:   . Worried About Programme researcher, broadcasting/film/video in the Last Year: Not on file  . Ran Out of Food in the Last Year: Not on file  Transportation Needs:   . Lack of Transportation (Medical): Not on file  . Lack of Transportation (Non-Medical): Not on file  Physical Activity:   . Days of Exercise per Week: Not on file  . Minutes of Exercise per Session: Not on file  Stress:   . Feeling of Stress : Not on file  Social Connections:   . Frequency of Communication with Friends and Family: Not on file  . Frequency of Social Gatherings with Friends and Family: Not on file  . Attends Religious Services: Not on file  . Active Member of Clubs or Organizations: Not on file  . Attends Banker Meetings: Not on file  . Marital Status: Not on file     Family History: The patient's family history includes Alcohol abuse in his father; Heart attack in his mother.  ROS:   Review of Systems  Constitution: Negative for decreased appetite, fever and weight gain.  HENT: Negative for congestion, ear discharge, hoarse voice and sore throat.   Eyes: Negative for discharge, redness, vision loss in right eye and visual halos.  Cardiovascular: Negative for chest pain, dyspnea on exertion, leg swelling, orthopnea and palpitations.  Respiratory: Negative for cough, hemoptysis, shortness of breath and snoring.   Endocrine: Negative for heat intolerance and polyphagia.  Hematologic/Lymphatic: Negative for bleeding problem. Does not bruise/bleed easily.  Skin: Negative for  flushing, nail changes, rash and suspicious lesions.  Musculoskeletal: Negative for arthritis, joint pain, muscle cramps, myalgias, neck pain and stiffness.  Gastrointestinal: Negative for abdominal pain, bowel incontinence, diarrhea and excessive appetite.  Genitourinary: Negative for decreased libido, genital sores and incomplete emptying.  Neurological: Negative for brief paralysis, focal weakness, headaches and loss of balance.  Psychiatric/Behavioral: Negative for altered mental status, depression and suicidal ideas.  Allergic/Immunologic: Negative for HIV exposure and persistent infections.    EKGs/Labs/Other Studies Reviewed:    The following studies were reviewed today:   EKG: None today.  Also reviewed his ZIO monitor which she wore from August 30 September  5 which showed 3 episodes of supraventricular tachycardia which is likely atrial tachycardia with variable block.  I will I discussed this with the patient and his son in the office.   Recent Labs: 06/08/2019: BUN 18; Creatinine, Ser 1.01; Magnesium 2.0; Potassium 4.5; Sodium 143  Recent Lipid Panel No results found for: CHOL, TRIG, HDL, CHOLHDL, VLDL, LDLCALC, LDLDIRECT  Physical Exam:    VS:  BP (!) 152/84   Pulse 72   Ht 5\' 3"  (1.6 m)   Wt 233 lb (105.7 kg)   SpO2 96%   BMI 41.27 kg/m     Wt Readings from Last 3 Encounters:  12/18/19 233 lb (105.7 kg)  11/20/19 219 lb 12.8 oz (99.7 kg)  07/12/19 216 lb (98 kg)     GEN: Well nourished, well developed in no acute distress HEENT: Normal NECK: No JVD; No carotid bruits LYMPHATICS: No lymphadenopathy CARDIAC: S1S2 noted,RRR, no murmurs, rubs, gallops RESPIRATORY:  Clear to auscultation without rales, wheezing or rhonchi  ABDOMEN: Soft, non-tender, non-distended, +bowel sounds, no guarding. EXTREMITIES: No edema, No cyanosis, no clubbing MUSCULOSKELETAL:  No deformity  SKIN: Warm and dry NEUROLOGIC:  Alert and oriented x 3, non-focal PSYCHIATRIC:  Normal  affect, good insight  ASSESSMENT:    1. Shortness of breath   2. Dizziness   3. Post concussive syndrome   4. Angina pectoris (HCC)   5. Essential hypertension   6. Mixed hyperlipidemia   7. Morbid obesity (HCC)    PLAN:     1.  His shortness of breath seems to be out of proportion I am concerned that this may be his anginal equivalent.  He has had a stress test in April 2021 which did not show any reversible ischemia but myocardial infarction.  At this time clinically is reasonable to proceed with a left heart catheterization in this patient.  Have educated the patient about the left heart catheterization he is agreeable to proceed.  The patient understands that risks include but are not limited to stroke (1 in 1000), death (1 in 1000), kidney failure  (1 in 500), bleeding (1 in 200), allergic reaction [possibly serious] (1 in 200), and agrees to proceed.  His dizziness is still concerning, I have asked the patient to see ENT to rule out inner ear dysfunction as well as given his fall couple months ago this could also be postconcussion syndrome which I am referring him to neurology for evaluation.  For now I do not believe the patient is clinically ready to go back to work as he tells me his job of standing all day.  In terms of his dizziness he will have to be cleared from neurology and/or ENT to go back to work.  His blood pressure is elevated 150/82 mmHg today in the office however given his dizziness I would like to hold off on adding additional medication.  He has not been taking the Lasix which he does do on as-needed basis.  His orthostatic vitals in the office today which was done by me was negative.  Sitting 152/82 mmHg, standing 148/78 mmHg.  The patient is in agreement with the above plan. The patient left the office in stable condition.  The patient will follow up in 2 weeks post cath.   Medication Adjustments/Labs and Tests Ordered: Current medicines are reviewed at length  with the patient today.  Concerns regarding medicines are outlined above.  Orders Placed This Encounter  Procedures  . Basic metabolic panel  . CBC with  Differential/Platelet  . Ambulatory referral to ENT  . Ambulatory referral to Neurology   No orders of the defined types were placed in this encounter.   Patient Instructions  Medication Instructions:  None ordered *If you need a refill on your cardiac medications before your next appointment, please call your pharmacy*   Lab Work: Your physician recommends that you   If you have labs (blood work) drawn today and your tests are completely normal, you will receive your results only by: Marland Kitchen MyChart Message (if you have MyChart) OR . A paper copy in the mail If you have any lab test that is abnormal or we need to change your treatment, we will call you to review the results.   Testing/Procedures:    [facility] AT  [address] [place] [address] Dept: [phone] Loc: [phone]  Austin Davenport  12/18/2019  You are scheduled for a Cardiac Catheterization on Wednesday, September 29 with Dr. Verdis Prime.  1. Please arrive at the [facility] (Main Entrance A) at [facility]: [address] [place], [place] [address] at 9:30 AM (This time is two hours before your procedure to ensure your preparation). Free valet parking service is available.   Special note: Every effort is made to have your procedure done on time. Please understand that emergencies sometimes delay scheduled procedures.  2. Diet: Do not eat solid foods after midnight.  The patient may have clear liquids until 5am upon the day of the procedure.  3. Labs: You had labs done today in the office. 4. Medication instructions in preparation for your procedure:   Contrast Allergy: No  Stop taking, Lasix (Furosemide)  and HTCZ (Hydrochlorothiazide) Wednesday, September 29,   Stop  taking, Mobic (Meloxicam), voltaren Wednesday, September 29,   On the morning of your procedure, take your Aspirin and any morning medicines NOT listed above.  You may use sips of water.  5. Plan for one night stay--bring personal belongings. 6. Bring a current list of your medications and current insurance cards. 7. You MUST have a responsible person to drive you home. 8. Someone MUST be with you the first 24 hours after you arrive home or your discharge will be delayed. 9. Please wear clothes that are easy to get on and off and wear slip-on shoes.  Thank you for allowing [place] to care for you!   -- [facility]    Follow-Up: At [facility], you and your health needs are our priority.  As part of our continuing mission to provide you with exceptional heart care, we have created designated Provider Care Teams.  These Care Teams include your primary Cardiologist (physician) and Advanced Practice Providers (APPs -  Physician Assistants and Nurse Practitioners) who all work together to provide you with the care you need, when you need it.  We recommend signing up for the patient portal called "MyChart".  Sign up information is provided on this After Visit Summary.  MyChart is used to connect with patients for Virtual Visits (Telemedicine).  Patients are able to view lab/test results, encounter notes, upcoming appointments, etc.  Non-urgent messages can be sent to your provider as well.   To learn more about what you can do with MyChart, go to [email or web address].    Your next appointment:   2 week(s)  The format for your next appointment:   In Person  Provider:   Thomasene Ripple, DO   Other Instructions  NA     Adopting a Healthy Lifestyle.  Know what a healthy weight is for you (roughly BMI <25) and aim to maintain this   Aim for 7+ servings of fruits and vegetables daily   65-80+ fluid ounces of water or unsweet tea for healthy kidneys     Limit to max 1 drink of alcohol per day; avoid smoking/tobacco   Limit animal fats in diet for cholesterol and heart health - choose grass fed whenever available   Avoid highly processed foods, and foods high in saturated/trans fats   Aim for low stress - take time to unwind and care for your mental health   Aim for 150 min of moderate intensity exercise weekly for heart health, and weights twice weekly for bone health   Aim for 7-9 hours of sleep daily   When it comes to diets, agreement about the perfect plan isnt easy to find, even among the experts. Experts at the [employer] developed an idea known as the Healthy Eating Plate. Just imagine a plate divided into logical, healthy portions.   The emphasis is on diet quality:   Load up on vegetables and fruits - one-half of your plate: Aim for color and variety, and remember that potatoes dont count.   Go for whole grains - one-quarter of your plate: Whole wheat, barley, wheat berries, quinoa, oats, brown rice, and foods made with them. If you want pasta, go with whole wheat pasta.   Protein power - one-quarter of your plate: Fish, chicken, beans, and nuts are all healthy, versatile protein sources. Limit red meat.   The diet, however, does go beyond the plate, offering a few other suggestions.   Use healthy plant oils, such as olive, canola, soy, corn, sunflower and peanut. Check the labels, and avoid partially hydrogenated oil, which have unhealthy trans fats.   If youre thirsty, drink water. Coffee and tea are good in moderation, but skip sugary drinks and limit milk and dairy products to one or two daily servings.   The type of carbohydrate in the diet is more important than the amount. Some sources of carbohydrates, such as vegetables, fruits, whole grains, and beans-are healthier than others.   Finally, stay active  Signed, Thomasene RippleKardie Braelynn Benning, DO  12/18/2019 2:40 PM    [facility]

## 2019-12-18 NOTE — Progress Notes (Addendum)
Cardiology Office Note:    Date:  12/18/2019   ID:  Dehaven Sine, DOB 07-Jan-1956, MRN [id number]  PCP:  Hortencia Conradi, NP  Cardiologist:  Thomasene Ripple, DO  Electrophysiologist:  None   Referring MD: Hortencia Conradi, NP   Follow up  History of Present Illness:    [facility] is a 64 y.o. male with a hx of hypertension, left bundle branch block, hyperlipidemia and depressed ejection fraction.  Did see the patient on July 12, 2019 at that time we discussed his stress test which showed depressed ejection fraction.  Also did asked the patient to get an echocardiogram but he never followed for this.  In previous visits his blood pressure was elevated and I had started him on hydrochlorothiazide.  In the interim the patient presented to the [facility] after he had a syncope episode.  During his hospitalization he had a chest x-ray which did not show any active disease.  He also had an echocardiogram which showed his LVEF to be normal at 50%.  In addition he had evidence of diastolic dysfunction trace mitral regurgitation trace tricuspid regurgitation were present.  He also had an EEG while he was admitted which also was reported to be normal.   I saw the patient 1 November 20, 2019 at that time he told me that his dizziness was significant.  I placed a monitor on the patient which he wore in the interim.  However today he is very worried as he has been experiencing significant shortness of breath on exertion and now notes that this is occurring at rest also.  He says he is not doing much but when he is sick now he is short of breath and is worsened when he is walking around.  This is bothersome.  At times he gets intermittent chest pain.    Past Medical History:  Diagnosis Date  . Abnormal electrocardiogram 06/08/2019  . Depressed left ventricular ejection fraction 07/12/2019  . Diarrhea   . Dizziness   . Erectile dysfunction   . Essential hypertension 06/08/2019  . LBBB  (left bundle branch block) 06/08/2019  . Left bundle branch block   . Mixed hyperlipidemia   . Morbid obesity (HCC) 06/08/2019  . Pain in right foot   . Pain in right knee   . Pre-operative cardiovascular examination 07/12/2019  . Testicular hypofunction   . Unilateral primary osteoarthritis, right knee   . Vitamin D deficiency     Past Surgical History:  Procedure Laterality Date  . CARPAL TUNNEL RELEASE  2009  . KIDNEY STONE SURGERY  2019  . skin biopsy  2009    Current Medications: Current Meds  Medication Sig  . albuterol (VENTOLIN HFA) 108 (90 Base) MCG/ACT inhaler Inhale 1 puff into the lungs every 6 (six) hours as needed for wheezing.   Marland Kitchen aspirin EC 81 MG tablet Take 81 mg by mouth daily.  Marland Kitchen atorvastatin (LIPITOR) 40 MG tablet Take 40 mg by mouth at bedtime.   . Azelastine-Fluticasone 137-50 MCG/ACT SUSP Place 1 spray into both nostrils daily.  . diclofenac Sodium (VOLTAREN) 1 % GEL Apply 1 application topically 2 (two) times daily as needed (pain).   . furosemide (LASIX) 40 MG tablet Take 40 mg by mouth daily.   . hydrochlorothiazide (MICROZIDE) 12.5 MG capsule TAKE 1 CAPSULE BY MOUTH EVERY DAY (Patient taking differently: Take 12.5 mg by mouth daily. )  . meclizine (ANTIVERT) 25 MG tablet Take 50 mg by mouth daily.   Marland Kitchen  meloxicam (MOBIC) 15 MG tablet Take 15 mg by mouth daily.  . metoprolol tartrate (LOPRESSOR) 25 MG tablet Take 25 mg by mouth 2 (two) times daily.  . traMADol (ULTRAM) 50 MG tablet Take 50 mg by mouth every 6 (six) hours as needed for moderate pain.   . Vitamin D, Ergocalciferol, (DRISDOL) 1.25 MG (50000 UNIT) CAPS capsule Take 50,000 Units by mouth 2 (two) times a week. Mondays and Fridays     Allergies:   Penicillins   Social History   Socioeconomic History  . Marital status: Married    Spouse name: Not on file  . Number of children: Not on file  . Years of education: Not on file  . Highest education level: Not on file  Occupational History  . Not  on file  Tobacco Use  . Smoking status: Current Every Day Smoker    Types: Cigarettes  . Smokeless tobacco: Never Used  Substance and Sexual Activity  . Alcohol use: Never  . Drug use: Never  . Sexual activity: Not on file  Other Topics Concern  . Not on file  Social History Narrative  . Not on file   Social Determinants of Health   Financial Resource Strain:   . Difficulty of Paying Living Expenses: Not on file  Food Insecurity:   . Worried About Running Out of Food in the Last Year: Not on file  . Ran Out of Food in the Last Year: Not on file  Transportation Needs:   . Lack of Transportation (Medical): Not on file  . Lack of Transportation (Non-Medical): Not on file  Physical Activity:   . Days of Exercise per Week: Not on file  . Minutes of Exercise per Session: Not on file  Stress:   . Feeling of Stress : Not on file  Social Connections:   . Frequency of Communication with Friends and Family: Not on file  . Frequency of Social Gatherings with Friends and Family: Not on file  . Attends Religious Services: Not on file  . Active Member of Clubs or Organizations: Not on file  . Attends Club or Organization Meetings: Not on file  . Marital Status: Not on file     Family History: The patient's family history includes Alcohol abuse in his father; Heart attack in his mother.  ROS:   Review of Systems  Constitution: Negative for decreased appetite, fever and weight gain.  HENT: Negative for congestion, ear discharge, hoarse voice and sore throat.   Eyes: Negative for discharge, redness, vision loss in right eye and visual halos.  Cardiovascular: Negative for chest pain, dyspnea on exertion, leg swelling, orthopnea and palpitations.  Respiratory: Negative for cough, hemoptysis, shortness of breath and snoring.   Endocrine: Negative for heat intolerance and polyphagia.  Hematologic/Lymphatic: Negative for bleeding problem. Does not bruise/bleed easily.  Skin: Negative for  flushing, nail changes, rash and suspicious lesions.  Musculoskeletal: Negative for arthritis, joint pain, muscle cramps, myalgias, neck pain and stiffness.  Gastrointestinal: Negative for abdominal pain, bowel incontinence, diarrhea and excessive appetite.  Genitourinary: Negative for decreased libido, genital sores and incomplete emptying.  Neurological: Negative for brief paralysis, focal weakness, headaches and loss of balance.  Psychiatric/Behavioral: Negative for altered mental status, depression and suicidal ideas.  Allergic/Immunologic: Negative for HIV exposure and persistent infections.    EKGs/Labs/Other Studies Reviewed:    The following studies were reviewed today:   EKG: None today.  Also reviewed his ZIO monitor which she wore from August 30 September   5 which showed 3 episodes of supraventricular tachycardia which is likely atrial tachycardia with variable block.  I will I discussed this with the patient and his son in the office.   Recent Labs: 06/08/2019: BUN 18; Creatinine, Ser 1.01; Magnesium 2.0; Potassium 4.5; Sodium 143  Recent Lipid Panel No results found for: CHOL, TRIG, HDL, CHOLHDL, VLDL, LDLCALC, LDLDIRECT  Physical Exam:    VS:  BP (!) 152/84   Pulse 72   Ht 5\' 3"  (1.6 m)   Wt 233 lb (105.7 kg)   SpO2 96%   BMI 41.27 kg/m     Wt Readings from Last 3 Encounters:  12/18/19 233 lb (105.7 kg)  11/20/19 219 lb 12.8 oz (99.7 kg)  07/12/19 216 lb (98 kg)     GEN: Well nourished, well developed in no acute distress HEENT: Normal NECK: No JVD; No carotid bruits LYMPHATICS: No lymphadenopathy CARDIAC: S1S2 noted,RRR, no murmurs, rubs, gallops RESPIRATORY:  Clear to auscultation without rales, wheezing or rhonchi  ABDOMEN: Soft, non-tender, non-distended, +bowel sounds, no guarding. EXTREMITIES: No edema, No cyanosis, no clubbing MUSCULOSKELETAL:  No deformity  SKIN: Warm and dry NEUROLOGIC:  Alert and oriented x 3, non-focal PSYCHIATRIC:  Normal  affect, good insight  ASSESSMENT:    1. Shortness of breath   2. Dizziness   3. Post concussive syndrome   4. Angina pectoris (HCC)   5. Essential hypertension   6. Mixed hyperlipidemia   7. Morbid obesity (HCC)    PLAN:     1.  His shortness of breath seems to be out of proportion I am concerned that this may be his anginal equivalent.  He has had a stress test in April 2021 which did not show any reversible ischemia but myocardial infarction.  At this time clinically is reasonable to proceed with a left heart catheterization in this patient.  Have educated the patient about the left heart catheterization he is agreeable to proceed.  The patient understands that risks include but are not limited to stroke (1 in 1000), death (1 in 1000), kidney failure  (1 in 500), bleeding (1 in 200), allergic reaction [possibly serious] (1 in 200), and agrees to proceed.  His dizziness is still concerning, I have asked the patient to see ENT to rule out inner ear dysfunction as well as given his fall couple months ago this could also be postconcussion syndrome which I am referring him to neurology for evaluation.  For now I do not believe the patient is clinically ready to go back to work as he tells me his job of standing all day.  In terms of his dizziness he will have to be cleared from neurology and/or ENT to go back to work.  His blood pressure is elevated 150/82 mmHg today in the office however given his dizziness I would like to hold off on adding additional medication.  He has not been taking the Lasix which he does do on as-needed basis.  His orthostatic vitals in the office today which was done by me was negative.  Sitting 152/82 mmHg, standing 148/78 mmHg.  The patient is in agreement with the above plan. The patient left the office in stable condition.  The patient will follow up in 2 weeks post cath.   Medication Adjustments/Labs and Tests Ordered: Current medicines are reviewed at length  with the patient today.  Concerns regarding medicines are outlined above.  Orders Placed This Encounter  Procedures  . Basic metabolic panel  . CBC with  Differential/Platelet  . Ambulatory referral to ENT  . Ambulatory referral to Neurology   No orders of the defined types were placed in this encounter.   Patient Instructions  Medication Instructions:  None ordered *If you need a refill on your cardiac medications before your next appointment, please call your pharmacy*   Lab Work: Your physician recommends that you   If you have labs (blood work) drawn today and your tests are completely normal, you will receive your results only by: Marland Kitchen MyChart Message (if you have MyChart) OR . A paper copy in the mail If you have any lab test that is abnormal or we need to change your treatment, we will call you to review the results.   Testing/Procedures:    [facility] DIV[facility] AT  [address] [place] [address] Dept: [phone] Loc: [phone]  Lio Wehrly  12/18/2019  You are scheduled for a Cardiac Catheterization on Wednesday, September 29 with Dr. Verdis Prime.  1. Please arrive at the [facility] (Main Entrance A) at [facility]: [address] [place], [place] [address] at 9:30 AM (This time is two hours before your procedure to ensure your preparation). Free valet parking service is available.   Special note: Every effort is made to have your procedure done on time. Please understand that emergencies sometimes delay scheduled procedures.  2. Diet: Do not eat solid foods after midnight.  The patient may have clear liquids until 5am upon the day of the procedure.  3. Labs: You had labs done today in the office. 4. Medication instructions in preparation for your procedure:   Contrast Allergy: No  Stop taking, Lasix (Furosemide)  and HTCZ (Hydrochlorothiazide) Wednesday, September 29,   Stop  taking, Mobic (Meloxicam), voltaren Wednesday, September 29,   On the morning of your procedure, take your Aspirin and any morning medicines NOT listed above.  You may use sips of water.  5. Plan for one night stay--bring personal belongings. 6. Bring a current list of your medications and current insurance cards. 7. You MUST have a responsible person to drive you home. 8. Someone MUST be with you the first 24 hours after you arrive home or your discharge will be delayed. 9. Please wear clothes that are easy to get on and off and wear slip-on shoes.  Thank you for allowing [place] to care for you!   -- [facility]    Follow-Up: At [facility], you and your health needs are our priority.  As part of our continuing mission to provide you with exceptional heart care, we have created designated Provider Care Teams.  These Care Teams include your primary Cardiologist (physician) and Advanced Practice Providers (APPs -  Physician Assistants and Nurse Practitioners) who all work together to provide you with the care you need, when you need it.  We recommend signing up for the patient portal called "MyChart".  Sign up information is provided on this After Visit Summary.  MyChart is used to connect with patients for Virtual Visits (Telemedicine).  Patients are able to view lab/test results, encounter notes, upcoming appointments, etc.  Non-urgent messages can be sent to your provider as well.   To learn more about what you can do with MyChart, go to [email or web address].    Your next appointment:   2 week(s)  The format for your next appointment:   In Person  Provider:   Thomasene Ripple, DO   Other Instructions  NA     Adopting a Healthy Lifestyle.  Know what a healthy weight is for you (roughly BMI <25) and aim to maintain this   Aim for 7+ servings of fruits and vegetables daily   65-80+ fluid ounces of water or unsweet tea for healthy kidneys     Limit to max 1 drink of alcohol per day; avoid smoking/tobacco   Limit animal fats in diet for cholesterol and heart health - choose grass fed whenever available   Avoid highly processed foods, and foods high in saturated/trans fats   Aim for low stress - take time to unwind and care for your mental health   Aim for 150 min of moderate intensity exercise weekly for heart health, and weights twice weekly for bone health   Aim for 7-9 hours of sleep daily   When it comes to diets, agreement about the perfect plan isnt easy to find, even among the experts. Experts at the [employer] developed an idea known as the Healthy Eating Plate. Just imagine a plate divided into logical, healthy portions.   The emphasis is on diet quality:   Load up on vegetables and fruits - one-half of your plate: Aim for color and variety, and remember that potatoes dont count.   Go for whole grains - one-quarter of your plate: Whole wheat, barley, wheat berries, quinoa, oats, brown rice, and foods made with them. If you want pasta, go with whole wheat pasta.   Protein power - one-quarter of your plate: Fish, chicken, beans, and nuts are all healthy, versatile protein sources. Limit red meat.   The diet, however, does go beyond the plate, offering a few other suggestions.   Use healthy plant oils, such as olive, canola, soy, corn, sunflower and peanut. Check the labels, and avoid partially hydrogenated oil, which have unhealthy trans fats.   If youre thirsty, drink water. Coffee and tea are good in moderation, but skip sugary drinks and limit milk and dairy products to one or two daily servings.   The type of carbohydrate in the diet is more important than the amount. Some sources of carbohydrates, such as vegetables, fruits, whole grains, and beans-are healthier than others.   Finally, stay active  Signed, Thomasene RippleKardie Satya Buttram, DO  12/18/2019 2:40 PM    [facility]

## 2019-12-18 NOTE — Addendum Note (Signed)
Addended by: Thomasene Ripple on: 12/18/2019 02:41 PM   Modules accepted: Orders, SmartSet

## 2019-12-18 NOTE — Patient Instructions (Addendum)
Medication Instructions:  None ordered *If you need a refill on your cardiac medications before your next appointment, please call your pharmacy*   Lab Work: Your physician recommends that you   If you have labs (blood work) drawn today and your tests are completely normal, you will receive your results only by: Marland Kitchen MyChart Message (if you have MyChart) OR . A paper copy in the mail If you have any lab test that is abnormal or we need to change your treatment, we will call you to review the results.   Testing/Procedures:    [facility] [address] [place] [address] Dept: [phone] Loc: [phone]  [facility] Davenport  12/18/2019  You are scheduled for a Cardiac Catheterization on Wednesday, September 29 with Dr. Verdis [facility].  1. Please arrive at the [facility] (Main Entrance A) at [facility]: [address] [place], [place] [address] at 9:30 AM (This time is two hours before your procedure to ensure your preparation). Free valet parking service is available.   Special note: Every effort is made to have your procedure done on time. Please understand that emergencies sometimes delay scheduled procedures.  2. Diet: Do not eat solid foods after midnight.  The patient may have clear liquids until 5am upon the day of the procedure.  3. Labs: You had labs done today in the office. 4. Medication instructions in preparation for your procedure:   Contrast Allergy: No  Stop taking, Lasix (Furosemide)  and HTCZ (Hydrochlorothiazide) Wednesday, September 29,   Stop taking, Mobic (Meloxicam), voltaren Wednesday, September 29,   On the morning of your procedure, take your Aspirin and any morning medicines NOT listed above.  You may use sips of water.  5. Plan for one night stay--bring personal belongings. 6. Bring a current list of your medications and current insurance cards. 7. You MUST have  a responsible person to drive you home. 8. Someone MUST be with you the first 24 hours after you arrive home or your discharge will be delayed. 9. Please wear clothes that are easy to get on and off and wear slip-on shoes.  Thank you for allowing [place] to care for you!   -- [facility]    Follow-Up: At [facility], you and your health needs are our priority.  As part of our continuing mission to provide you with exceptional heart care, we have created designated Provider Care Teams.  These Care Teams include your primary Cardiologist (physician) and Advanced Practice Providers (APPs -  Physician Assistants and Nurse Practitioners) who all work together to provide you with the care you need, when you need it.  We recommend signing up for the patient portal called "MyChart".  Sign up information is provided on this After Visit Summary.  MyChart is used to connect with patients for Virtual Visits (Telemedicine).  Patients are able to view lab/test results, encounter notes, upcoming appointments, etc.  Non-urgent messages can be sent to your provider as well.   To learn more about what you can do with MyChart, go to [email or web address].    Your next appointment:   2 week(s)  The format for your next appointment:   In Person  Provider:   Thomasene Ripple, DO   Other Instructions NA

## 2019-12-19 ENCOUNTER — Other Ambulatory Visit (HOSPITAL_COMMUNITY)
Admission: RE | Admit: 2019-12-19 | Discharge: 2019-12-19 | Disposition: A | Discharge status: Home / Self Care | Payer: Commercial Managed Care - PPO | Source: Ambulatory Visit | Attending: Interventional Cardiology | Admitting: Interventional Cardiology

## 2019-12-19 ENCOUNTER — Telehealth: Payer: Self-pay | Admitting: *Deleted

## 2019-12-19 DIAGNOSIS — Z01812 Encounter for preprocedural laboratory examination: Secondary | ICD-10-CM | POA: Insufficient documentation

## 2019-12-19 DIAGNOSIS — Z20822 Contact with and (suspected) exposure to covid-19: Secondary | ICD-10-CM | POA: Insufficient documentation

## 2019-12-19 LAB — BASIC METABOLIC PANEL
BUN/Creatinine Ratio: 18 (ref 10–24)
BUN: 22 mg/dL (ref 8–27)
CO2: 26 mmol/L (ref 20–29)
Calcium: 9.2 mg/dL (ref 8.6–10.2)
Chloride: 102 mmol/L (ref 96–106)
Creatinine, Ser: 1.2 mg/dL (ref 0.76–1.27)
GFR calc Af Amer: 73 mL/min/{1.73_m2} (ref >59)
GFR calc non Af Amer: 64 mL/min/{1.73_m2} (ref >59)
Glucose: 97 mg/dL (ref 65–99)
Potassium: 4.9 mmol/L (ref 3.5–5.2)
Sodium: 142 mmol/L (ref 134–144)

## 2019-12-19 LAB — CBC WITH DIFFERENTIAL/PLATELET
Basophils Absolute: 0.1 10*3/uL (ref 0.0–0.2)
Basos: 1 %
EOS (ABSOLUTE): 0.3 10*3/uL (ref 0.0–0.4)
Eos: 3 %
Hematocrit: 42.2 % (ref 37.5–51.0)
Hemoglobin: 14.2 g/dL (ref 13.0–17.7)
Immature Grans (Abs): 0.2 10*3/uL — ABNORMAL HIGH (ref 0.0–0.1)
Immature Granulocytes: 2 %
Lymphocytes Absolute: 2.8 10*3/uL (ref 0.7–3.1)
Lymphs: 26 %
MCH: 32 pg (ref 26.6–33.0)
MCHC: 33.6 g/dL (ref 31.5–35.7)
MCV: 95 fL (ref 79–97)
Monocytes Absolute: 0.7 10*3/uL (ref 0.1–0.9)
Monocytes: 7 %
Neutrophils Absolute: 6.4 10*3/uL (ref 1.4–7.0)
Neutrophils: 61 %
Platelets: 201 10*3/uL (ref 150–450)
RBC: 4.44 x10E6/uL (ref 4.14–5.80)
RDW: 11.8 % (ref 11.6–15.4)
WBC: 10.5 10*3/uL (ref 3.4–10.8)

## 2019-12-19 LAB — SARS CORONAVIRUS 2 (TAT 6-24 HRS): SARS Coronavirus 2: NEGATIVE

## 2019-12-19 NOTE — Telephone Encounter (Signed)
Pt contacted pre-catheterization scheduled at [facility] for: Wednesday December 20, 2019 11:30 AM Verified arrival time and place: [facility] Main Entrance A [facility]) at: 9:30 AM   No solid food after midnight prior to cath, clear liquids until 5 AM day of procedure.  Hold: Lasix-AM of procedure HCTZ-AM of procedure  Except hold medications AM meds can be  taken pre-cath with sips of water including: ASA 81 mg   Confirmed patient has responsible adult to drive home post procedure and be with patient first 24 hours after arriving home: yes  You are allowed ONE visitor in the waiting room during the time you are at the hospital for your procedure. Both you and your visitor must wear a mask once you enter the hospital.       COVID-19 Pre-Screening Questions:   In the past 14 days have you had a new cough, new headache, new nasal congestion, fever (100.4 or greater) unexplained body aches, new sore throat, or sudden loss of taste or sense of smell? no  In the past 14 days have you been around anyone with known Covid 19? no  Have you been vaccinated for COVID-19? Yes, see immunization history   Reviewed procedure/mask/visitor instructions, COVID-19 questions with patient.

## 2019-12-19 NOTE — H&P (Signed)
·   Increased SOB, atypical CP, and mild reduction in EF.

## 2019-12-20 ENCOUNTER — Encounter (HOSPITAL_COMMUNITY)
Admission: RE | Disposition: A | Discharge status: Home / Self Care | Payer: Self-pay | Source: Home / Self Care | Attending: Interventional Cardiology

## 2019-12-20 ENCOUNTER — Ambulatory Visit (HOSPITAL_COMMUNITY)
Admission: RE | Admit: 2019-12-20 | Discharge: 2019-12-20 | Disposition: A | Discharge status: Home / Self Care | Payer: Commercial Managed Care - PPO | Attending: Interventional Cardiology | Admitting: Interventional Cardiology

## 2019-12-20 DIAGNOSIS — Z79899 Other long term (current) drug therapy: Secondary | ICD-10-CM | POA: Diagnosis not present

## 2019-12-20 DIAGNOSIS — R0602 Shortness of breath: Secondary | ICD-10-CM

## 2019-12-20 DIAGNOSIS — Z7982 Long term (current) use of aspirin: Secondary | ICD-10-CM | POA: Diagnosis not present

## 2019-12-20 DIAGNOSIS — Z880 Allergy status to penicillin: Secondary | ICD-10-CM | POA: Diagnosis not present

## 2019-12-20 DIAGNOSIS — F0781 Postconcussional syndrome: Secondary | ICD-10-CM | POA: Diagnosis not present

## 2019-12-20 DIAGNOSIS — R931 Abnormal findings on diagnostic imaging of heart and coronary circulation: Secondary | ICD-10-CM

## 2019-12-20 DIAGNOSIS — R9431 Abnormal electrocardiogram [ECG] [EKG]: Secondary | ICD-10-CM | POA: Diagnosis present

## 2019-12-20 DIAGNOSIS — F17210 Nicotine dependence, cigarettes, uncomplicated: Secondary | ICD-10-CM | POA: Insufficient documentation

## 2019-12-20 DIAGNOSIS — I10 Essential (primary) hypertension: Secondary | ICD-10-CM | POA: Diagnosis not present

## 2019-12-20 DIAGNOSIS — I25118 Atherosclerotic heart disease of native coronary artery with other forms of angina pectoris: Secondary | ICD-10-CM | POA: Insufficient documentation

## 2019-12-20 DIAGNOSIS — R42 Dizziness and giddiness: Secondary | ICD-10-CM | POA: Insufficient documentation

## 2019-12-20 DIAGNOSIS — Z6841 Body Mass Index (BMI) 40.0 and over, adult: Secondary | ICD-10-CM | POA: Diagnosis not present

## 2019-12-20 DIAGNOSIS — E782 Mixed hyperlipidemia: Secondary | ICD-10-CM | POA: Insufficient documentation

## 2019-12-20 DIAGNOSIS — I447 Left bundle-branch block, unspecified: Secondary | ICD-10-CM | POA: Diagnosis present

## 2019-12-20 HISTORY — PX: LEFT HEART CATH AND CORONARY ANGIOGRAPHY: CATH118249

## 2019-12-20 SURGERY — LEFT HEART CATH AND CORONARY ANGIOGRAPHY
Anesthesia: LOCAL

## 2019-12-20 MED ORDER — SODIUM CHLORIDE 0.9 % IV SOLN: 250.0000 mL | INTRAVENOUS | Status: DC | PRN

## 2019-12-20 MED ORDER — SODIUM CHLORIDE 0.9% FLUSH: 3.0000 mL | Freq: Two times a day (BID) | INTRAVENOUS | Status: DC

## 2019-12-20 MED ORDER — HYDRALAZINE HCL 20 MG/ML IJ SOLN: 10.0000 mg | INTRAMUSCULAR | Status: DC | PRN

## 2019-12-20 MED ORDER — NITROGLYCERIN 1 MG/10 ML FOR IR/CATH LAB
INTRA_ARTERIAL | Status: DC | PRN
  Administered 2019-12-20: 200 ug via INTRACORONARY

## 2019-12-20 MED ORDER — SODIUM CHLORIDE 0.9 % WEIGHT BASED INFUSION: 1.0000 mL/kg/h | INTRAVENOUS | Status: DC

## 2019-12-20 MED ORDER — SODIUM CHLORIDE 0.9% FLUSH: 3.0000 mL | INTRAVENOUS | Status: DC | PRN

## 2019-12-20 MED ORDER — MIDAZOLAM HCL 2 MG/2ML IJ SOLN
INTRAMUSCULAR | Status: AC
  Filled 2019-12-20: qty 2

## 2019-12-20 MED ORDER — HEPARIN (PORCINE) IN NACL 1000-0.9 UT/500ML-% IV SOLN
INTRAVENOUS | Status: DC | PRN
  Administered 2019-12-20 (×2): 500 mL

## 2019-12-20 MED ORDER — FENTANYL CITRATE (PF) 100 MCG/2ML IJ SOLN
INTRAMUSCULAR | Status: AC
  Filled 2019-12-20: qty 2

## 2019-12-20 MED ORDER — OXYCODONE HCL 5 MG PO TABS: 5.0000 mg | ORAL_TABLET | ORAL | Status: DC | PRN

## 2019-12-20 MED ORDER — IOHEXOL 350 MG/ML SOLN
INTRAVENOUS | Status: DC | PRN
  Administered 2019-12-20: 80 mL

## 2019-12-20 MED ORDER — ACETAMINOPHEN 325 MG PO TABS: 650.0000 mg | ORAL_TABLET | ORAL | Status: DC | PRN

## 2019-12-20 MED ORDER — ASPIRIN 81 MG PO CHEW: 81.0000 mg | CHEWABLE_TABLET | Freq: Every day | ORAL | Status: DC

## 2019-12-20 MED ORDER — LIDOCAINE HCL (PF) 1 % IJ SOLN
INTRAMUSCULAR | Status: DC | PRN
  Administered 2019-12-20: 5 mL

## 2019-12-20 MED ORDER — SODIUM CHLORIDE 0.9 % WEIGHT BASED INFUSION
3.0000 mL/kg/h | INTRAVENOUS | Status: AC
  Administered 2019-12-20: 3 mL/kg/h via INTRAVENOUS

## 2019-12-20 MED ORDER — HEPARIN SODIUM (PORCINE) 1000 UNIT/ML IJ SOLN
INTRAMUSCULAR | Status: AC
  Filled 2019-12-20: qty 1

## 2019-12-20 MED ORDER — MIDAZOLAM HCL 2 MG/2ML IJ SOLN
INTRAMUSCULAR | Status: DC | PRN
  Administered 2019-12-20: 1 mg via INTRAVENOUS

## 2019-12-20 MED ORDER — LABETALOL HCL 5 MG/ML IV SOLN: 10.0000 mg | INTRAVENOUS | Status: DC | PRN

## 2019-12-20 MED ORDER — HEPARIN SODIUM (PORCINE) 1000 UNIT/ML IJ SOLN
INTRAMUSCULAR | Status: DC | PRN
  Administered 2019-12-20: 5000 [IU] via INTRAVENOUS

## 2019-12-20 MED ORDER — SODIUM CHLORIDE 0.9 % IV SOLN: INTRAVENOUS | Status: AC

## 2019-12-20 MED ORDER — VERAPAMIL HCL 2.5 MG/ML IV SOLN
INTRAVENOUS | Status: AC
  Filled 2019-12-20: qty 2

## 2019-12-20 MED ORDER — VERAPAMIL HCL 2.5 MG/ML IV SOLN
INTRAVENOUS | Status: DC | PRN
  Administered 2019-12-20: 10 mL via INTRA_ARTERIAL

## 2019-12-20 MED ORDER — ONDANSETRON HCL 4 MG/2ML IJ SOLN: 4.0000 mg | Freq: Four times a day (QID) | INTRAMUSCULAR | Status: DC | PRN

## 2019-12-20 MED ORDER — NITROGLYCERIN 1 MG/10 ML FOR IR/CATH LAB
INTRA_ARTERIAL | Status: AC
  Filled 2019-12-20: qty 10

## 2019-12-20 MED ORDER — LIDOCAINE HCL (PF) 1 % IJ SOLN
INTRAMUSCULAR | Status: AC
  Filled 2019-12-20: qty 30

## 2019-12-20 MED ORDER — FENTANYL CITRATE (PF) 100 MCG/2ML IJ SOLN
INTRAMUSCULAR | Status: DC | PRN
Start: 2019-12-20 — End: 2019-12-20
  Administered 2019-12-20: 50 ug via INTRAVENOUS

## 2019-12-20 MED ORDER — HEPARIN (PORCINE) IN NACL 1000-0.9 UT/500ML-% IV SOLN
INTRAVENOUS | Status: AC
  Filled 2019-12-20: qty 1000

## 2019-12-20 SURGICAL SUPPLY — 11 items
CATH 5FR JL3.5 JR4 ANG PIG MP (CATHETERS) ×1 IMPLANT
DEVICE RAD COMP TR BAND LRG (VASCULAR PRODUCTS) ×1 IMPLANT
GLIDESHEATH SLEND A-KIT 6F 22G (SHEATH) ×1 IMPLANT
GUIDEWIRE INQWIRE 1.5J.035X260 (WIRE) IMPLANT
INQWIRE 1.5J .035X260CM (WIRE) ×2
KIT HEART LEFT (KITS) ×2 IMPLANT
PACK CARDIAC CATHETERIZATION (CUSTOM PROCEDURE TRAY) ×2 IMPLANT
SHEATH PROBE COVER 6X72 (BAG) ×1 IMPLANT
TRANSDUCER W/STOPCOCK (MISCELLANEOUS) ×2 IMPLANT
TUBING CIL FLEX 10 FLL-RA (TUBING) ×2 IMPLANT
WIRE HI TORQ VERSACORE-J 145CM (WIRE) ×1 IMPLANT

## 2019-12-20 NOTE — Progress Notes (Signed)
D/C instructions discussed and reviewed with pt, son on speaker phone at bedside, verbalized understanding.  Will continue to monitor.

## 2019-12-20 NOTE — CV Procedure (Signed)
   Moderate proximal to mid LAD diffuse 60% narrowing.  Eccentric 70 to 80% proximal LAD.  Mid to distal PDA 60%.  Diffuse 50% mid first obtuse marginal.  Patent left main  LVEDP 15 mmHg.  Estimated EF 50%.  Recommendations: Lifestyle intervention, risk factor modification for lipids and glycemic control if needed.  Moderate three-vessel coronary disease for which conservative medical management is recommended.

## 2019-12-20 NOTE — Discharge Instructions (Signed)
Radial Site Care  This sheet gives you information about how to care for yourself after your procedure. Your health care provider may also give you more specific instructions. If you have problems or questions, contact your health care provider. What can I expect after the procedure? After the procedure, it is common to have:  Bruising and tenderness at the catheter insertion area. Follow these instructions at home: Medicines  Take over-the-counter and prescription medicines only as told by your health care provider. Insertion site care  Follow instructions from your health care provider about how to take care of your insertion site. Make sure you: ? Wash your hands with soap and water before you change your bandage (dressing). If soap and water are not available, use hand sanitizer. ? Change your dressing as told by your health care provider. ? Leave stitches (sutures), skin glue, or adhesive strips in place. These skin closures may need to stay in place for 2 weeks or longer. If adhesive strip edges start to loosen and curl up, you may trim the loose edges. Do not remove adhesive strips completely unless your health care provider tells you to do that.  Check your insertion site every day for signs of infection. Check for: ? Redness, swelling, or pain. ? Fluid or blood. ? Pus or a bad smell. ? Warmth.  Do not take baths, swim, or use a hot tub until your health care provider approves.  You may shower 24-48 hours after the procedure, or as directed by your health care provider. ? Remove the dressing and gently wash the site with plain soap and water. ? Pat the area dry with a clean towel. ? Do not rub the site. That could cause bleeding.  Do not apply powder or lotion to the site. Activity   For 24 hours after the procedure, or as directed by your health care provider: ? Do not flex or bend the affected arm. ? Do not push or pull heavy objects with the affected arm. ? Do not  drive yourself home from the hospital or clinic. You may drive 24 hours after the procedure unless your health care provider tells you not to. ? Do not operate machinery or power tools.  Do not lift anything that is heavier than 10 lb (4.5 kg), or the limit that you are told, until your health care provider says that it is safe.  Ask your health care provider when it is okay to: ? Return to work or school. ? Resume usual physical activities or sports. ? Resume sexual activity. General instructions  If the catheter site starts to bleed, raise your arm and put firm pressure on the site. If the bleeding does not stop, get help right away. This is a medical emergency.  If you went home on the same day as your procedure, a responsible adult should be with you for the first 24 hours after you arrive home.  Keep all follow-up visits as told by your health care provider. This is important. Contact a health care provider if:  You have a fever.  You have redness, swelling, or yellow drainage around your insertion site. Get help right away if:  You have unusual pain at the radial site.  The catheter insertion area swells very fast.  The insertion area is bleeding, and the bleeding does not stop when you hold steady pressure on the area.  Your arm or hand becomes pale, cool, tingly, or numb. These symptoms may represent a serious problem   that is an emergency. Do not wait to see if the symptoms will go away. Get medical help right away. Call your local emergency services (911 in the [place] Do not drive yourself to the hospital. Summary  After the procedure, it is common to have bruising and tenderness at the site.  Follow instructions from your health care provider about how to take care of your radial site wound. Check the wound every day for signs of infection.  Do not lift anything that is heavier than 10 lb (4.5 kg), or the limit that you are told, until your health care provider says  that it is safe. This information is not intended to replace advice given to you by your health care provider. Make sure you discuss any questions you have with your health care provider. Document Revised: 04/14/2017 Document Reviewed: 04/14/2017 Elsevier Patient Education  2020 [employer]vier Inc[employer]  

## 2019-12-20 NOTE — Interval H&P Note (Signed)
Cath Lab Visit (complete for each Cath Lab visit)  Clinical Evaluation Leading to the Procedure:   ACS: No.  Non-ACS:    Anginal Classification: CCS II  Anti-ischemic medical therapy: Minimal Therapy (1 class of medications)  Non-Invasive Test Results: Intermediate-risk stress test findings: cardiac mortality 1-3%/year  Prior CABG: No previous CABG      History and Physical Interval Note:  12/20/2019 9:53 AM  Austin Davenport  has presented today for surgery, with the diagnosis of angina - sob.  The various methods of treatment have been discussed with the patient and family. After consideration of risks, benefits and other options for treatment, the patient has consented to  Procedure(s): LEFT HEART CATH AND CORONARY ANGIOGRAPHY (N/A) as a surgical intervention.  The patient's history has been reviewed, patient examined, no change in status, stable for surgery.  I have reviewed the patient's chart and labs.  Questions were answered to the patient's satisfaction.     Lyn Records III

## 2019-12-21 ENCOUNTER — Encounter (HOSPITAL_COMMUNITY): Payer: Self-pay | Admitting: Interventional Cardiology

## 2020-01-02 ENCOUNTER — Ambulatory Visit (INDEPENDENT_AMBULATORY_CARE_PROVIDER_SITE_OTHER): Payer: Commercial Managed Care - PPO | Admitting: Cardiology

## 2020-01-02 ENCOUNTER — Encounter: Payer: Self-pay | Admitting: Cardiology

## 2020-01-02 ENCOUNTER — Encounter: Payer: Self-pay | Admitting: Emergency Medicine

## 2020-01-02 ENCOUNTER — Other Ambulatory Visit: Payer: Self-pay

## 2020-01-02 VITALS — BP 138/82 | HR 85 | Ht 61.0 in | Wt 233.4 lb

## 2020-01-02 DIAGNOSIS — E782 Mixed hyperlipidemia: Secondary | ICD-10-CM

## 2020-01-02 DIAGNOSIS — R42 Dizziness and giddiness: Secondary | ICD-10-CM

## 2020-01-02 DIAGNOSIS — R600 Localized edema: Secondary | ICD-10-CM | POA: Diagnosis not present

## 2020-01-02 DIAGNOSIS — I447 Left bundle-branch block, unspecified: Secondary | ICD-10-CM | POA: Diagnosis not present

## 2020-01-02 DIAGNOSIS — I2510 Atherosclerotic heart disease of native coronary artery without angina pectoris: Secondary | ICD-10-CM | POA: Diagnosis not present

## 2020-01-02 DIAGNOSIS — I10 Essential (primary) hypertension: Secondary | ICD-10-CM

## 2020-01-02 MED ORDER — FUROSEMIDE 40 MG PO TABS: 40.0000 mg | ORAL_TABLET | Freq: Two times a day (BID) | ORAL | 2 refills | Status: DC

## 2020-01-02 MED ORDER — POTASSIUM CHLORIDE CRYS ER 20 MEQ PO TBCR: 20.0000 meq | EXTENDED_RELEASE_TABLET | Freq: Two times a day (BID) | ORAL | 2 refills | Status: DC

## 2020-01-02 MED ORDER — ISOSORBIDE MONONITRATE ER 30 MG PO TB24: 15.0000 mg | ORAL_TABLET | Freq: Every day | ORAL | 1 refills | Status: DC

## 2020-01-02 NOTE — Progress Notes (Signed)
Cardiology Office Note:    Date:  01/02/2020   ID:  Austin Davenport, DOB 1955-12-02, MRN [id number]  PCP:  Hortencia Conradi, NP  Cardiologist:  Thomasene Ripple, DO  Electrophysiologist:  None   Referring MD: Hortencia Conradi, NP   " I am still dizzy with some shortness of breath"  History of Present Illness:     Austin Davenport a 63 y.o.malewith a hx of hypertension, left bundle branch block, hyperlipidemia and depressed ejection fraction. Did see the patient on July 12, 2019 at that time we discussed his stress test which showed depressed ejection fraction. Also did asked the patient to get an echocardiogram but he never followed for this. In previous visits his blood pressure was elevated and I had started him on hydrochlorothiazide.  In the interim the patient presented to the [facility] after he had a syncope episode. During his hospitalization he had a chest x-ray which did not show any active disease. He also had an echocardiogram which showed his LVEF to be normal at 50%.In addition he had evidence of diastolic dysfunction trace mitral regurgitation trace tricuspid regurgitation were present. He also had an EEG while he was admitted which also was reported to be normal.   I saw the patient 1 November 20, 2019 at that time he told me that his dizziness was significant.  I placed a monitor on the patient which he wore in the interim which showed rare paroxysmal supraventricular tachycardia.  I saw the patient on 12/18/19 on at that time she was still dizzy but reported significant shortness of breath and chest pain. Given his previous stress test showed infarction and the symptoms out of proportion I recommended a LHC. In addition I asked the patient to see ENT for evaluation for dizziness and also requested that he see neurology for concern of post concussion syndrome.   He was able to get his LHC with showed multivessel disease with no PCI. He is here today for a  follow up visit. He still have some dizziness and is concern but unfortunately he has not been able see ENT. He is pending a visit with neurology. He reports that he also have shortness of breath on exertion .  He is here today with his wife.  Past Medical History:  Diagnosis Date  . Abnormal electrocardiogram 06/08/2019  . Depressed left ventricular ejection fraction 07/12/2019  . Diarrhea   . Dizziness   . Erectile dysfunction   . Essential hypertension 06/08/2019  . LBBB (left bundle branch block) 06/08/2019  . Left bundle branch block   . Mixed hyperlipidemia   . Morbid obesity (HCC) 06/08/2019  . Pain in right foot   . Pain in right knee   . Pre-operative cardiovascular examination 07/12/2019  . Testicular hypofunction   . Unilateral primary osteoarthritis, right knee   . Vitamin D deficiency     Past Surgical History:  Procedure Laterality Date  . CARPAL TUNNEL RELEASE  2009  . KIDNEY STONE SURGERY  2019  . LEFT HEART CATH AND CORONARY ANGIOGRAPHY N/A 12/20/2019   Procedure: LEFT HEART CATH AND CORONARY ANGIOGRAPHY;  Surgeon: Lyn Records, MD;  Location: [facility] INVASIVE CV LAB;  Service: Cardiovascular;  Laterality: N/A;  . skin biopsy  2009    Current Medications: Current Meds  Medication Sig  . albuterol (VENTOLIN HFA) 108 (90 Base) MCG/ACT inhaler Inhale 1 puff into the lungs every 6 (six) hours as needed for wheezing.   Marland Kitchen aspirin EC 81  MG tablet Take 81 mg by mouth daily.  Marland Kitchen atorvastatin (LIPITOR) 40 MG tablet Take 40 mg by mouth at bedtime.   . Azelastine-Fluticasone 137-50 MCG/ACT SUSP Place 1 spray into both nostrils daily.  . diclofenac Sodium (VOLTAREN) 1 % GEL Apply 1 application topically 2 (two) times daily as needed (pain).   . furosemide (LASIX) 40 MG tablet Take 1 tablet (40 mg total) by mouth 2 (two) times daily.  . hydrochlorothiazide (MICROZIDE) 12.5 MG capsule TAKE 1 CAPSULE BY MOUTH EVERY DAY (Patient taking differently: Take 12.5 mg by mouth daily. )  .  meclizine (ANTIVERT) 25 MG tablet Take 50 mg by mouth daily.   . meloxicam (MOBIC) 15 MG tablet Take 15 mg by mouth daily.  . metoprolol tartrate (LOPRESSOR) 25 MG tablet Take 25 mg by mouth 2 (two) times daily.  . traMADol (ULTRAM) 50 MG tablet Take 50 mg by mouth every 6 (six) hours as needed for moderate pain.   . Vitamin D, Ergocalciferol, (DRISDOL) 1.25 MG (50000 UNIT) CAPS capsule Take 50,000 Units by mouth 2 (two) times a week. Mondays and Fridays  . [DISCONTINUED] furosemide (LASIX) 40 MG tablet Take 40 mg by mouth daily.      Allergies:   Penicillins   Social History   Socioeconomic History  . Marital status: Married    Spouse name: Not on file  . Number of children: Not on file  . Years of education: Not on file  . Highest education level: Not on file  Occupational History  . Not on file  Tobacco Use  . Smoking status: Former Smoker    Types: Cigarettes    Quit date: 2021    Years since quitting: 0.7  . Smokeless tobacco: Former Engineer, water and Sexual Activity  . Alcohol use: Never  . Drug use: Never  . Sexual activity: Not on file  Other Topics Concern  . Not on file  Social History Narrative  . Not on file   Social Determinants of Health   Financial Resource Strain:   . Difficulty of Paying Living Expenses: Not on file  Food Insecurity:   . Worried About Programme researcher, broadcasting/film/video in the Last Year: Not on file  . Ran Out of Food in the Last Year: Not on file  Transportation Needs:   . Lack of Transportation (Medical): Not on file  . Lack of Transportation (Non-Medical): Not on file  Physical Activity:   . Days of Exercise per Week: Not on file  . Minutes of Exercise per Session: Not on file  Stress:   . Feeling of Stress : Not on file  Social Connections:   . Frequency of Communication with Friends and Family: Not on file  . Frequency of Social Gatherings with Friends and Family: Not on file  . Attends Religious Services: Not on file  . Active Member of  Clubs or Organizations: Not on file  . Attends Banker Meetings: Not on file  . Marital Status: Not on file     Family History: The patient's family history includes Alcohol abuse in his father; Heart attack in his mother.  ROS:   Review of Systems  Constitution: Reports dizziness, Negative for decreased appetite, fever and weight gain.  HENT: Negative for congestion, ear discharge, hoarse voice and sore throat.   Eyes: Negative for discharge, redness, vision loss in right eye and visual halos.  Cardiovascular: Reports shortness of breath,Negative for chest pain,  leg swelling, orthopnea and palpitations.  Respiratory: Negative for cough, hemoptysis, shortness of breath and snoring.   Endocrine: Negative for heat intolerance and polyphagia.  Hematologic/Lymphatic: Negative for bleeding problem. Does not bruise/bleed easily.  Skin: Negative for flushing, nail changes, rash and suspicious lesions.  Musculoskeletal: Negative for arthritis, joint pain, muscle cramps, myalgias, neck pain and stiffness.  Gastrointestinal: Negative for abdominal pain, bowel incontinence, diarrhea and excessive appetite.  Genitourinary: Negative for decreased libido, genital sores and incomplete emptying.  Neurological: Negative for brief paralysis, focal weakness, headaches and loss of balance.  Psychiatric/Behavioral: Negative for altered mental status, depression and suicidal ideas.  Allergic/Immunologic: Negative for HIV exposure and persistent infections.    EKGs/Labs/Other Studies Reviewed:    The following studies were reviewed today:   EKG:  None   LHC   Distal left main and proximal to mid LAD calcification.  Widely patent left main  Diffuse 50 to 65% proximal to mid LAD crossing over a large first diagonal.  A large second obtuse marginal contains segmental proximal 40 to 50% narrowing.  Codominant RCA which supplies a very large PDA that reaches the apex contains 75%  eccentric proximal stenosis.  Mid to distal PDA 60% stenosis.  Low normal LV systolic function with estimated EF 50%.  LVEDP 15 mmHg.  RECOMMENDATIONS:   Aggressive risk factor modification/optimal medical therapy to decrease risk of progressive coronary disease.  Zio Monitor  The patient wore the monitor for 6 days 2 hours starting November 20, 2019. Indication: Dizziness  The minimum heart rate was 46 bpm, maximum heart rate was 143 bpm, and average heart rate was 73 Bpm. Predominant underlying rhythm was Sinus Rhythm.  3 Supraventricular Tachycardia runs occurred, the run with the fastest interval lasting 16 beats with a maximum rate of 143 bpm (average 114 bpm); the run with the fastest interval was also the longest.  Premature atrial complexes were rare less than 1%. Premature Ventricular complexes were rare less than 1%.  No ventricular tachycardia, no pauses, No AV block and no atrial fibrillation present. 4 patient triggered events: 1 associated with premature ventricular tachycardia the remaining is associated with sinus rhythm.  1 diary event associated with sinus rhythm.  Conclusion: This study is remarkable for rare episodes of asymptomatic paroxysmal supraventricular tachycardia.    Recent Labs: 06/08/2019: Magnesium 2.0 12/18/2019: BUN 22; Creatinine, Ser 1.20; Hemoglobin 14.2; Platelets 201; Potassium 4.9; Sodium 142  Recent Lipid Panel No results found for: CHOL, TRIG, HDL, CHOLHDL, VLDL, LDLCALC, LDLDIRECT  Physical Exam:    VS:  BP 138/82 (BP Location: Left Arm, Patient Position: Sitting, Cuff Size: Large)   Pulse 85   Ht 5\' 1"  (1.549 m)   Wt 233 lb 6.4 oz (105.9 kg)   SpO2 95%   BMI 44.10 kg/m     Wt Readings from Last 3 Encounters:  01/02/20 233 lb 6.4 oz (105.9 kg)  12/20/19 230 lb (104.3 kg)  12/18/19 233 lb (105.7 kg)     GEN: Well nourished, well developed in no acute distress HEENT: Normal NECK: No JVD; No carotid bruits LYMPHATICS: No  lymphadenopathy CARDIAC: S1S2 noted,RRR, no murmurs, rubs, gallops RESPIRATORY:  Clear to auscultation without rales, wheezing or rhonchi  ABDOMEN: Soft, non-tender, non-distended, +bowel sounds, no guarding. EXTREMITIES: + 1 bilateral leg edema, No cyanosis, no clubbing MUSCULOSKELETAL:  No deformity  SKIN: Warm and dry NEUROLOGIC:  Alert and oriented x 3, non-focal PSYCHIATRIC:  Normal affect, good insight  ASSESSMENT:    1. Dizziness   2. Coronary artery disease, unspecified  vessel or lesion type, unspecified whether angina present, unspecified whether native or transplanted heart   3. Bilateral leg edema   4. Left bundle branch block   5. Essential hypertension   6. Morbid obesity (HCC)   7. Mixed hyperlipidemia    PLAN:     1. He is still with intermittent dizziness. I have advised the patient that if he does not see ENT by his next visit I am unable to clear him for work or do any work notes. As he will need to work with his pcp. I doubt his dizziness is of cardiovascular origin. He is pending neurology evaluation on 11/24.  HE has newly diagnosed CAD from his LHC. Will add Imdur to his regimen if his shortness of breath is his Anginal equivalent. He will continue his Aspirin, statin as well as his beta blocker.   His has bilateral worsening leg edema which is worse compared to his last visit. I will increase his lasix cautiously. Lasix 40 mg twice daily with potassium supplement.   I reviewed his home blood pressure which is averaging 150/95mg . With slight increase in  his diuretic  He is requesting disability- I have asked to patient to discuss with his pcp for this process.   We discuss healthy diet and once can tolerate increase exercise   Blood work will be done today.  The patient is in agreement with the above plan. The patient left the office in stable condition.  The patient will follow up in 1 month.   Medication Adjustments/Labs and Tests Ordered: Current  medicines are reviewed at length with the patient today.  Concerns regarding medicines are outlined above.  Orders Placed This Encounter  Procedures  . Basic metabolic panel  . Magnesium  . Ambulatory referral to ENT   Meds ordered this encounter  Medications  . isosorbide mononitrate (IMDUR) 30 MG 24 hr tablet    Sig: Take 0.5 tablets (15 mg total) by mouth daily.    Dispense:  45 tablet    Refill:  1  . furosemide (LASIX) 40 MG tablet    Sig: Take 1 tablet (40 mg total) by mouth 2 (two) times daily.    Dispense:  60 tablet    Refill:  2  . potassium chloride SA (KLOR-CON) 20 MEQ tablet    Sig: Take 1 tablet (20 mEq total) by mouth 2 (two) times daily.    Dispense:  60 tablet    Refill:  2    Patient Instructions  Medication Instructions:  Your physician has recommended you make the following change in your medication:  START: Imdur 15 mg daily   START: Lasix 40 mg twice daily   START: Potassium 20 meq twice daily   *If you need a refill on your cardiac medications before your next appointment, please call your pharmacy*   Lab Work: Your physician recommends that you return for lab work today: bmp, mg  If you have labs (blood work) drawn today and your tests are completely normal, you will receive your results only by: Marland Kitchen MyChart Message (if you have MyChart) OR . A paper copy in the mail If you have any lab test that is abnormal or we need to change your treatment, we will call you to review the results.   Testing/Procedures: None.   Follow-Up: At [facility], you and your health needs are our priority.  As part of our continuing mission to provide you with exceptional heart care, we have created designated  Provider Care Teams.  These Care Teams include your primary Cardiologist (physician) and Advanced Practice Providers (APPs -  Physician Assistants and Nurse Practitioners) who all work together to provide you with the care you need, when you need it.  We  recommend signing up for the patient portal called "MyChart".  Sign up information is provided on this After Visit Summary.  MyChart is used to connect with patients for Virtual Visits (Telemedicine).  Patients are able to view lab/test results, encounter notes, upcoming appointments, etc.  Non-urgent messages can be sent to your provider as well.   To learn more about what you can do with MyChart, go to [email or web address].    Your next appointment:   1 month(s)  The format for your next appointment:   In Person  Provider:   Thomasene Ripple, DO   Other Instructions       Adopting a Healthy Lifestyle.  Know what a healthy weight is for you (roughly BMI <25) and aim to maintain this   Aim for 7+ servings of fruits and vegetables daily   65-80+ fluid ounces of water or unsweet tea for healthy kidneys   Limit to max 1 drink of alcohol per day; avoid smoking/tobacco   Limit animal fats in diet for cholesterol and heart health - choose grass fed whenever available   Avoid highly processed foods, and foods high in saturated/trans fats   Aim for low stress - take time to unwind and care for your mental health   Aim for 150 min of moderate intensity exercise weekly for heart health, and weights twice weekly for bone health   Aim for 7-9 hours of sleep daily   When it comes to diets, agreement about the perfect plan isnt easy to find, even among the experts. Experts at the [facility] of [employer] developed an idea known as the Healthy Eating Plate. Just imagine a plate divided into logical, healthy portions.   The emphasis is on diet quality:   Load up on vegetables and fruits - one-half of your plate: Aim for color and variety, and remember that potatoes dont count.   Go for whole grains - one-quarter of your plate: Whole wheat, barley, wheat berries, quinoa, oats, brown rice, and foods made with them. If you want pasta, go with whole wheat pasta.   Protein power -  one-quarter of your plate: Fish, chicken, beans, and nuts are all healthy, versatile protein sources. Limit red meat.   The diet, however, does go beyond the plate, offering a few other suggestions.   Use healthy plant oils, such as olive, canola, soy, corn, sunflower and peanut. Check the labels, and avoid partially hydrogenated oil, which have unhealthy trans fats.   If youre thirsty, drink water. Coffee and tea are good in moderation, but skip sugary drinks and limit milk and dairy products to one or two daily servings.   The type of carbohydrate in the diet is more important than the amount. Some sources of carbohydrates, such as vegetables, fruits, whole grains, and beans-are healthier than others.   Finally, stay active  Signed, Thomasene Ripple, DO  01/02/2020 7:39 PM    [facility]

## 2020-01-02 NOTE — Patient Instructions (Signed)
Medication Instructions:  Your physician has recommended you make the following change in your medication:  START: Imdur 15 mg daily   START: Lasix 40 mg twice daily   START: Potassium 20 meq twice daily   *If you need a refill on your cardiac medications before your next appointment, please call your pharmacy*   Lab Work: Your physician recommends that you return for lab work today: bmp, mg  If you have labs (blood work) drawn today and your tests are completely normal, you will receive your results only by: Marland Kitchen MyChart Message (if you have MyChart) OR . A paper copy in the mail If you have any lab test that is abnormal or we need to change your treatment, we will call you to review the results.   Testing/Procedures: None.   Follow-Up: At [facility], you and your health needs are our priority.  As part of our continuing mission to provide you with exceptional heart care, we have created designated Provider Care Teams.  These Care Teams include your primary Cardiologist (physician) and Advanced Practice Providers (APPs -  Physician Assistants and Nurse Practitioners) who all work together to provide you with the care you need, when you need it.  We recommend signing up for the patient portal called "MyChart".  Sign up information is provided on this After Visit Summary.  MyChart is used to connect with patients for Virtual Visits (Telemedicine).  Patients are able to view lab/test results, encounter notes, upcoming appointments, etc.  Non-urgent messages can be sent to your provider as well.   To learn more about what you can do with MyChart, go to [email or web address].    Your next appointment:   1 month(s)  The format for your next appointment:   In Person  Provider:   Thomasene Ripple, DO   Other Instructions

## 2020-01-03 LAB — BASIC METABOLIC PANEL
BUN/Creatinine Ratio: 25 — ABNORMAL HIGH (ref 10–24)
BUN: 27 mg/dL (ref 8–27)
CO2: 23 mmol/L (ref 20–29)
Calcium: 9.5 mg/dL (ref 8.6–10.2)
Chloride: 101 mmol/L (ref 96–106)
Creatinine, Ser: 1.1 mg/dL (ref 0.76–1.27)
GFR calc Af Amer: 82 mL/min/{1.73_m2} (ref >59)
GFR calc non Af Amer: 71 mL/min/{1.73_m2} (ref >59)
Glucose: 104 mg/dL — ABNORMAL HIGH (ref 65–99)
Potassium: 4.2 mmol/L (ref 3.5–5.2)
Sodium: 143 mmol/L (ref 134–144)

## 2020-01-03 LAB — MAGNESIUM: Magnesium: 2.1 mg/dL (ref 1.6–2.3)

## 2020-01-04 ENCOUNTER — Telehealth: Payer: Self-pay

## 2020-01-04 NOTE — Telephone Encounter (Signed)
-----   Message from Kardie Tobb, DO sent at 01/03/2020  6:02 PM EDT ----- Stable labs 

## 2020-01-04 NOTE — Telephone Encounter (Signed)
Spoke with patient regarding results and recommendation.  Patient verbalizes understanding and is agreeable to plan of care. Advised patient to call back with any issues or concerns.  

## 2020-01-09 ENCOUNTER — Telehealth: Payer: Self-pay | Admitting: Cardiology

## 2020-01-09 NOTE — Telephone Encounter (Signed)
It is completed

## 2020-01-09 NOTE — Telephone Encounter (Signed)
Pt called and stated he had dropped off papers to fill out for his job for him to be out til Nov.  He would like to know if that paperwork has been completed and when he is able to pick them up ?     Best number [phone]

## 2020-01-10 NOTE — Telephone Encounter (Signed)
Letter has to be retyped. Once retyped will have Dr. Servando Salina sign. Patient aware he plans to pick up tomorrow afternoon.

## 2020-02-06 ENCOUNTER — Other Ambulatory Visit: Payer: Self-pay

## 2020-02-06 ENCOUNTER — Ambulatory Visit (INDEPENDENT_AMBULATORY_CARE_PROVIDER_SITE_OTHER): Payer: Commercial Managed Care - PPO | Admitting: Cardiology

## 2020-02-06 ENCOUNTER — Encounter: Payer: Self-pay | Admitting: Cardiology

## 2020-02-06 VITALS — BP 132/84 | HR 75 | Ht 61.0 in | Wt 238.4 lb

## 2020-02-06 DIAGNOSIS — I10 Essential (primary) hypertension: Secondary | ICD-10-CM | POA: Diagnosis not present

## 2020-02-06 DIAGNOSIS — I2510 Atherosclerotic heart disease of native coronary artery without angina pectoris: Secondary | ICD-10-CM | POA: Insufficient documentation

## 2020-02-06 DIAGNOSIS — I447 Left bundle-branch block, unspecified: Secondary | ICD-10-CM

## 2020-02-06 LAB — BASIC METABOLIC PANEL
BUN/Creatinine Ratio: 14 (ref 10–24)
BUN: 16 mg/dL (ref 8–27)
CO2: 26 mmol/L (ref 20–29)
Calcium: 9.4 mg/dL (ref 8.6–10.2)
Chloride: 102 mmol/L (ref 96–106)
Creatinine, Ser: 1.15 mg/dL (ref 0.76–1.27)
GFR calc Af Amer: 77 mL/min/{1.73_m2} (ref >59)
GFR calc non Af Amer: 67 mL/min/{1.73_m2} (ref >59)
Glucose: 97 mg/dL (ref 65–99)
Potassium: 4.3 mmol/L (ref 3.5–5.2)
Sodium: 141 mmol/L (ref 134–144)

## 2020-02-06 LAB — MAGNESIUM: Magnesium: 2.1 mg/dL (ref 1.6–2.3)

## 2020-02-06 NOTE — Progress Notes (Signed)
Cardiology Office Note:    Date:  02/06/2020   ID:  Austin Davenport, DOB Sep 25, 1955, MRN [id number]  PCP:  Hortencia ConradiGeorge, Robert E, NP  Cardiologist:  Thomasene RippleKardie Maxine Fredman, DO  Electrophysiologist:  None   Referring MD: Hortencia ConradiGeorge, Robert E, NP   " I still feel dizzy sometimes"  History of Present Illness:    Austin RainwaterJose Davenport is a 64 y.o. male with a hx of hypertension, left bundle branch block, hyperlipidemia and depressed ejection fraction.Did see the patient on July 12, 2019 at that time we discussed his stress test which showed depressed ejection fraction. Also did asked the patient to get an echocardiogram but he never followed for this. In previous visits his blood pressure was elevated and I had started him on hydrochlorothiazide.  In the interim the patient [facility] after he had a syncope episode. During his hospitalization he had a chest x-ray which did not show any active disease. He also had an echocardiogram which showed his LVEF to be normal at 50%.In addition he had evidence of diastolic dysfunction trace mitral regurgitation trace tricuspid regurgitation were present. He also had an EEG while he was admitted which also was reported to be normal.   I saw the patient 1 November 20, 2019 at that time he told me that his dizziness was significant. I placed a monitor on the patient which he wore in the interim which showed rare paroxysmal supraventricular tachycardia.  I saw the patient on 12/18/19 on at that time she was still dizzy but reported significant shortness of breath and chest pain. Given his previous stress test showed infarction and the symptoms out of proportion I recommended a LHC. In addition I asked the patient to see ENT for evaluation for dizziness and also requested that he see neurology for concern of post concussion syndrome.  I saw the patient 1 January 02, 2020, at that time he was status pos LHC with showed multivessel disease with no PCI.   During that visit he had not seen ENT.    At the conclusion of the visit I recommended that the patient needed to see ENT as he had been complaining of continuous dizziness.Marland Kitchen.  He is here today for follow-up visit he has been doing well from a cardiovascular standpoint with no anginal symptoms since his Imdur was added.  I am going to continue this as well.  His leg edema has also improved and so has his blood pressure. Unfortunately he does have some dizziness and he still has not been able to see ENT patient tells me he is scheduled for December 13.  Past Medical History:  Diagnosis Date  . Abnormal electrocardiogram 06/08/2019  . Depressed left ventricular ejection fraction 07/12/2019  . Diarrhea   . Dizziness   . Erectile dysfunction   . Essential hypertension 06/08/2019  . LBBB (left bundle branch block) 06/08/2019  . Left bundle branch block   . Mixed hyperlipidemia   . Morbid obesity (HCC) 06/08/2019  . Pain in right foot   . Pain in right knee   . Pre-operative cardiovascular examination 07/12/2019  . Testicular hypofunction   . Unilateral primary osteoarthritis, right knee   . Vitamin D deficiency     Past Surgical History:  Procedure Laterality Date  . CARPAL TUNNEL RELEASE  2009  . KIDNEY STONE SURGERY  2019  . LEFT HEART CATH AND CORONARY ANGIOGRAPHY N/A 12/20/2019   Procedure: LEFT HEART CATH AND CORONARY ANGIOGRAPHY;  Surgeon: Lyn RecordsSmith, Henry W,  MD;  Location: [facility] INVASIVE CV LAB;  Service: Cardiovascular;  Laterality: N/A;  . skin biopsy  2009    Current Medications: Current Meds  Medication Sig  . albuterol (VENTOLIN HFA) 108 (90 Base) MCG/ACT inhaler Inhale 1 puff into the lungs every 6 (six) hours as needed for wheezing.   Marland Kitchen aspirin EC 81 MG tablet Take 81 mg by mouth daily.  Marland Kitchen atorvastatin (LIPITOR) 40 MG tablet Take 40 mg by mouth at bedtime.   . Azelastine-Fluticasone 137-50 MCG/ACT SUSP Place 1 spray into both nostrils daily.  . diclofenac Sodium (VOLTAREN) 1 %  GEL Apply 1 application topically 2 (two) times daily as needed (pain).   . furosemide (LASIX) 40 MG tablet Take 1 tablet (40 mg total) by mouth 2 (two) times daily.  . hydrochlorothiazide (MICROZIDE) 12.5 MG capsule TAKE 1 CAPSULE BY MOUTH EVERY DAY (Patient taking differently: Take 12.5 mg by mouth daily. )  . isosorbide mononitrate (IMDUR) 30 MG 24 hr tablet Take 0.5 tablets (15 mg total) by mouth daily.  . meclizine (ANTIVERT) 25 MG tablet Take 50 mg by mouth daily.   . meloxicam (MOBIC) 15 MG tablet Take 15 mg by mouth daily.  . metoprolol tartrate (LOPRESSOR) 25 MG tablet Take 25 mg by mouth 2 (two) times daily.  . potassium chloride SA (KLOR-CON) 20 MEQ tablet Take 1 tablet (20 mEq total) by mouth 2 (two) times daily.  . traMADol (ULTRAM) 50 MG tablet Take 50 mg by mouth every 6 (six) hours as needed for moderate pain.   . Vitamin D, Ergocalciferol, (DRISDOL) 1.25 MG (50000 UNIT) CAPS capsule Take 50,000 Units by mouth 2 (two) times a week. Mondays and Fridays     Allergies:   Penicillins   Social History   Socioeconomic History  . Marital status: Married    Spouse name: Not on file  . Number of children: Not on file  . Years of education: Not on file  . Highest education level: Not on file  Occupational History  . Not on file  Tobacco Use  . Smoking status: Former Smoker    Types: Cigarettes    Quit date: 2021    Years since quitting: 0.8  . Smokeless tobacco: Former Engineer, water and Sexual Activity  . Alcohol use: Never  . Drug use: Never  . Sexual activity: Not on file  Other Topics Concern  . Not on file  Social History Narrative  . Not on file   Social Determinants of Health   Financial Resource Strain:   . Difficulty of Paying Living Expenses: Not on file  Food Insecurity:   . Worried About Programme researcher, broadcasting/film/video in the Last Year: Not on file  . Ran Out of Food in the Last Year: Not on file  Transportation Needs:   . Lack of Transportation (Medical): Not on  file  . Lack of Transportation (Non-Medical): Not on file  Physical Activity:   . Days of Exercise per Week: Not on file  . Minutes of Exercise per Session: Not on file  Stress:   . Feeling of Stress : Not on file  Social Connections:   . Frequency of Communication with Friends and Family: Not on file  . Frequency of Social Gatherings with Friends and Family: Not on file  . Attends Religious Services: Not on file  . Active Member of Clubs or Organizations: Not on file  . Attends Banker Meetings: Not on file  . Marital Status: Not  on file     Family History: The patient's family history includes Alcohol abuse in his father; Heart attack in his mother.  ROS:   Review of Systems  Constitution: Negative for decreased appetite, fever and weight gain.  HENT: Negative for congestion, ear discharge, hoarse voice and sore throat.   Eyes: Negative for discharge, redness, vision loss in right eye and visual halos.  Cardiovascular: Negative for chest pain, dyspnea on exertion, leg swelling, orthopnea and palpitations.  Respiratory: Negative for cough, hemoptysis, shortness of breath and snoring.   Endocrine: Negative for heat intolerance and polyphagia.  Hematologic/Lymphatic: Negative for bleeding problem. Does not bruise/bleed easily.  Skin: Negative for flushing, nail changes, rash and suspicious lesions.  Musculoskeletal: Negative for arthritis, joint pain, muscle cramps, myalgias, neck pain and stiffness.  Gastrointestinal: Negative for abdominal pain, bowel incontinence, diarrhea and excessive appetite.  Genitourinary: Negative for decreased libido, genital sores and incomplete emptying.  Neurological: Negative for brief paralysis, focal weakness, headaches and loss of balance.  Psychiatric/Behavioral: Negative for altered mental status, depression and suicidal ideas.  Allergic/Immunologic: Negative for HIV exposure and persistent infections.    EKGs/Labs/Other Studies  Reviewed:    The following studies were reviewed today:   EKG: None today.  LHC   Distal left main and proximal to mid LAD calcification.  Widely patent left main  Diffuse 50 to 65% proximal to mid LAD crossing over a large first diagonal.  A large second obtuse marginal contains segmental proximal 40 to 50% narrowing.  Codominant RCA which supplies a very large PDA that reaches the apex contains 75% eccentric proximal stenosis. Mid to distal PDA 60% stenosis.  Low normal LV systolic function with estimated EF 50%. LVEDP 15 mmHg.  RECOMMENDATIONS:   Aggressive risk factor modification/optimal medical therapy to decrease risk of progressive coronary disease.  Zio Monitor  The patient wore the monitor for 6 days 2 hours starting November 20, 2019. Indication: Dizziness  The minimum heart rate was 46 bpm, maximum heart rate was 143 bpm, and average heart rate was 73 Bpm. Predominant underlying rhythm was Sinus Rhythm.  3 Supraventricular Tachycardia runs occurred, the run with the fastest interval lasting 16 beats with a maximum rate of 143 bpm (average 114 bpm); the run with the fastest interval was also the longest.  Premature atrial complexes were rare less than 1%. Premature Ventricular complexes were rare less than 1%.  No ventricular tachycardia, no pauses, No AV block and no atrial fibrillation present. 4 patient triggered events: 1 associated with premature ventricular tachycardia the remainingis associated with sinus rhythm. 1 diary event associated with sinus rhythm.  Conclusion: This study is remarkable for rare episodes of asymptomatic paroxysmal supraventricular tachycardia.  Recent Labs: 12/18/2019: Hemoglobin 14.2; Platelets 201 01/02/2020: BUN 27; Creatinine, Ser 1.10; Magnesium 2.1; Potassium 4.2; Sodium 143  Recent Lipid Panel No results found for: CHOL, TRIG, HDL, CHOLHDL, VLDL, LDLCALC, LDLDIRECT  Physical Exam:    VS:  BP 132/84   Pulse  75   Ht  (1.549 m)   Wt 238 lb 6.4 oz (108.1 kg)   SpO2 97%   BMI 45.05 kg/m     Wt Readings from Last 3 Encounters:  02/06/20 238 lb 6.4 oz (108.1 kg)  01/02/20 233 lb 6.4 oz (105.9 kg)  12/20/19 230 lb (104.3 kg)     GEN: Well nourished, well developed in no acute distress HEENT: Normal NECK: No JVD; No carotid bruits LYMPHATICS: No lymphadenopathy CARDIAC: S1S2 noted,RRR, no murmurs, rubs,  gallops RESPIRATORY:  Clear to auscultation without rales, wheezing or rhonchi  ABDOMEN: Soft, non-tender, non-distended, +bowel sounds, no guarding. EXTREMITIES: No edema, No cyanosis, no clubbing MUSCULOSKELETAL:  No deformity  SKIN: Warm and dry NEUROLOGIC:  Alert and oriented x 3, non-focal PSYCHIATRIC:  Normal affect, good insight  ASSESSMENT:    1. Essential hypertension   2. LBBB (left bundle branch block)   3. Morbid obesity (HCC)   4. Coronary artery disease involving native coronary artery of native heart without angina pectoris    PLAN:     He has no angina symptoms this regimen is working for him.  Continue aspirin 81 mg daily, Imdur 15 mg daily Lopressor and atorvastatin.  His blood pressure is acceptable we will keep him on the current medicine He is planning to see ENT in December as well as neurology.  I encouraged the patient to keep this medication regimen as we need to explore noncardiac reasons for his dizziness. He is working up with his primary care doctor for disability.  I did explain to the patient that this needs to be primarily done with his PCP.  We can provide any documentation on our testing that we have done here.  Blood work will be done for [employer] and mag.  The patient is in agreement with the above plan. The patient left the office in stable condition.  The patient will follow up in 6 months or sooner if needed.   Medication Adjustments/Labs and Tests Ordered: Current medicines are reviewed at length with the patient today.  Concerns regarding  medicines are outlined above.  Orders Placed This Encounter  Procedures  . Basic metabolic panel  . Magnesium   No orders of the defined types were placed in this encounter.   Patient Instructions  Medication Instructions:  No medication changes. *If you need a refill on your cardiac medications before your next appointment, please call your pharmacy*   Lab Work: Your physician recommends that you have labs done in the office today. Your test included  basic metabolic panel and magnesium.  If you have labs (blood work) drawn today and your tests are completely normal, you will receive your results only by: Marland Kitchen MyChart Message (if you have MyChart) OR . A paper copy in the mail If you have any lab test that is abnormal or we need to change your treatment, we will call you to review the results.   Testing/Procedures: None ordered   Follow-Up: At [facility], you and your health needs are our priority.  As part of our continuing mission to provide you with exceptional heart care, we have created designated Provider Care Teams.  These Care Teams include your primary Cardiologist (physician) and Advanced Practice Providers (APPs -  Physician Assistants and Nurse Practitioners) who all work together to provide you with the care you need, when you need it.  We recommend signing up for the patient portal called "MyChart".  Sign up information is provided on this After Visit Summary.  MyChart is used to connect with patients for Virtual Visits (Telemedicine).  Patients are able to view lab/test results, encounter notes, upcoming appointments, etc.  Non-urgent messages can be sent to your provider as well.   To learn more about what you can do with MyChart, go to [email or web address].    Your next appointment:   3 month(s)  The format for your next appointment:   In Person  Provider:   Thomasene Ripple, DO   Other Instructions NA  Adopting a Healthy Lifestyle.  Know what a  healthy weight is for you (roughly BMI <25) and aim to maintain this   Aim for 7+ servings of fruits and vegetables daily   65-80+ fluid ounces of water or unsweet tea for healthy kidneys   Limit to max 1 drink of alcohol per day; avoid smoking/tobacco   Limit animal fats in diet for cholesterol and heart health - choose grass fed whenever available   Avoid highly processed foods, and foods high in saturated/trans fats   Aim for low stress - take time to unwind and care for your mental health   Aim for 150 min of moderate intensity exercise weekly for heart health, and weights twice weekly for bone health   Aim for 7-9 hours of sleep daily   When it comes to diets, agreement about the perfect plan isnt easy to find, even among the experts. Experts at the [facility] of [employer] developed an idea known as the Healthy Eating Plate. Just imagine a plate divided into logical, healthy portions.   The emphasis is on diet quality:   Load up on vegetables and fruits - one-half of your plate: Aim for color and variety, and remember that potatoes dont count.   Go for whole grains - one-quarter of your plate: Whole wheat, barley, wheat berries, quinoa, oats, brown rice, and foods made with them. If you want pasta, go with whole wheat pasta.   Protein power - one-quarter of your plate: Fish, chicken, beans, and nuts are all healthy, versatile protein sources. Limit red meat.   The diet, however, does go beyond the plate, offering a few other suggestions.   Use healthy plant oils, such as olive, canola, soy, corn, sunflower and peanut. Check the labels, and avoid partially hydrogenated oil, which have unhealthy trans fats.   If youre thirsty, drink water. Coffee and tea are good in moderation, but skip sugary drinks and limit milk and dairy products to one or two daily servings.   The type of carbohydrate in the diet is more important than the amount. Some sources of carbohydrates,  such as vegetables, fruits, whole grains, and beans-are healthier than others.   Finally, stay active  Signed, Thomasene Ripple, DO  02/06/2020 12:01 PM    [facility]

## 2020-02-06 NOTE — Patient Instructions (Addendum)
Medication Instructions:  No medication changes. *If you need a refill on your cardiac medications before your next appointment, please call your pharmacy*   Lab Work: Your physician recommends that you have labs done in the office today. Your test included  basic metabolic panel and magnesium.  If you have labs (blood work) drawn today and your tests are completely normal, you will receive your results only by: . MyChart Message (if you have MyChart) OR . A paper copy in the mail If you have any lab test that is abnormal or we need to change your treatment, we will call you to review the results.   Testing/Procedures: None ordered   Follow-Up: At [facility], you and your health needs are our priority.  As part of our continuing mission to provide you with exceptional heart care, we have created designated Provider Care Teams.  These Care Teams include your primary Cardiologist (physician) and Advanced Practice Providers (APPs -  Physician Assistants and Nurse Practitioners) who all work together to provide you with the care you need, when you need it.  We recommend signing up for the patient portal called "MyChart".  Sign up information is provided on this After Visit Summary.  MyChart is used to connect with patients for Virtual Visits (Telemedicine).  Patients are able to view lab/test results, encounter notes, upcoming appointments, etc.  Non-urgent messages can be sent to your provider as well.   To learn more about what you can do with MyChart, go to [email or web address].    Your next appointment:   3 month(s)  The format for your next appointment:   In Person  Provider:   Kardie Tobb, DO   Other Instructions NA  

## 2020-02-07 ENCOUNTER — Telehealth: Payer: Self-pay

## 2020-02-07 NOTE — Telephone Encounter (Signed)
Spoke with patient regarding results and recommendation.  Patient verbalizes understanding and is agreeable to plan of care. Advised patient to call back with any issues or concerns.  

## 2020-02-07 NOTE — Telephone Encounter (Signed)
-----   Message from Thomasene Ripple, DO sent at 02/07/2020  9:06 AM EST ----- Stable labs.

## 2020-02-12 ENCOUNTER — Telehealth: Payer: Self-pay | Admitting: Cardiology

## 2020-02-12 NOTE — Telephone Encounter (Signed)
Patient came by office and stated he needs a note stating he is out of work from 02/03/20 through 02/14/20. He would like someone to call him when the letter is ready to be picked up.

## 2020-02-12 NOTE — Telephone Encounter (Signed)
Unfortunately I cannot provide this note for the patient.  I have told him in multiple visits that he would need to get this note from his PCP.  After working out all of his cardiac stuff I did ask him to see neurology as well as ENT.  All further work notes needs to come from his PCP.

## 2020-02-13 NOTE — Telephone Encounter (Signed)
Spoke to the patient just now and let him know that Dr. Servando Salina can not provide this note to him. I let him know that this would need to come from his PCP. He verbalizes understanding and thanks me for the call back.   Encouraged patient to call back with any questions or concerns.

## 2020-02-13 NOTE — Progress Notes (Addendum)
NEUROLOGY CONSULTATION NOTE  Austin Davenport MRN: [id number] DOB: 1955-05-31  Referring provider: Thomasene Ripple, DO Primary care provider: Clelia Croft, NP  Reason for consult:  dizziness   Subjective:  Austin Davenport is a 64 year old right-handed male with CAD, LBBB, HTN, and HLD who presents for dizziness.  History supplemented by hospital records and referring provider's notes.  He was hospitalized at [facility] from 8/25-8/26/2021 for syncope.  He reportedly felt spinning and sensation of going to pass out upon getting out of the car and then passed out hitting his head.  He has had this occur in the past, in which his eyes roll back and he has a few seconds of shaking.  No tongue biting, incontinence or postictal confusion.  No history of known seizures.  Upon arrival at the ED, systolic blood pressure was 190.  CT had showed small hematoma over the occipital scalp but no acute intracranial abnormality.  CT cervical spine showed degenerative changes but no fracture.  EEG was negative.  CXR was negative for active disease.  Echo showed EF 50% with grade 1 diastolic dysfunction and trace mitral regurgitation.   Vasovagal syncope was suspected.  Since hospitalization, he has had persistent dizziness described as a spinning sensation.  He gets sweaty, has nasal congestion and feels like he is going to pass out.  It occurs if he gets up too fast or lays down to quickly.  It lasts a couple of minutes.  Closing eyes makes it worse.  No nausea, visual disturbance, numbness, weakness or headache.  He continues to feel like he is going to pass out.  Meclizine ineffective.  Flonase helps a little bit.  He continues to feel short of breath.  7 day Zio patch showed rare paroxysmal supraventricular tachycardia.  He started experiencing shortness of breath, first on exertion and later at rest, as well as intermittent chest pain.  He underwent a cardiac catheterization which showed multivessel disease but no  PCI.       PAST MEDICAL HISTORY: Past Medical History:  Diagnosis Date  . Abnormal electrocardiogram 06/08/2019  . Depressed left ventricular ejection fraction 07/12/2019  . Diarrhea   . Dizziness   . Erectile dysfunction   . Essential hypertension 06/08/2019  . LBBB (left bundle branch block) 06/08/2019  . Left bundle branch block   . Mixed hyperlipidemia   . Morbid obesity (HCC) 06/08/2019  . Pain in right foot   . Pain in right knee   . Pre-operative cardiovascular examination 07/12/2019  . Testicular hypofunction   . Unilateral primary osteoarthritis, right knee   . Vitamin D deficiency     PAST SURGICAL HISTORY: Past Surgical History:  Procedure Laterality Date  . CARPAL TUNNEL RELEASE  2009  . KIDNEY STONE SURGERY  2019  . LEFT HEART CATH AND CORONARY ANGIOGRAPHY N/A 12/20/2019   Procedure: LEFT HEART CATH AND CORONARY ANGIOGRAPHY;  Surgeon: Lyn Records, MD;  Location: [facility];  Service: Cardiovascular;  Laterality: N/A;  . skin biopsy  2009    MEDICATIONS: Current Outpatient Medications on File Prior to Visit  Medication Sig Dispense Refill  . albuterol (VENTOLIN HFA) 108 (90 Base) MCG/ACT inhaler Inhale 1 puff into the lungs every 6 (six) hours as needed for wheezing.     Marland Kitchen aspirin EC 81 MG tablet Take 81 mg by mouth daily.    Marland Kitchen atorvastatin (LIPITOR) 40 MG tablet Take 40 mg by mouth at bedtime.     . Azelastine-Fluticasone  137-50 MCG/ACT SUSP Place 1 spray into both nostrils daily.    . diclofenac Sodium (VOLTAREN) 1 % GEL Apply 1 application topically 2 (two) times daily as needed (pain).     . furosemide (LASIX) 40 MG tablet Take 1 tablet (40 mg total) by mouth 2 (two) times daily. 60 tablet 2  . hydrochlorothiazide (MICROZIDE) 12.5 MG capsule TAKE 1 CAPSULE BY MOUTH EVERY DAY (Patient taking differently: Take 12.5 mg by mouth daily. ) 90 capsule 3  . isosorbide mononitrate (IMDUR) 30 MG 24 hr tablet Take 0.5 tablets (15 mg total) by mouth daily. 45  tablet 1  . meclizine (ANTIVERT) 25 MG tablet Take 50 mg by mouth daily.     . meloxicam (MOBIC) 15 MG tablet Take 15 mg by mouth daily.    . metoprolol tartrate (LOPRESSOR) 25 MG tablet Take 25 mg by mouth 2 (two) times daily.    . potassium chloride SA (KLOR-CON) 20 MEQ tablet Take 1 tablet (20 mEq total) by mouth 2 (two) times daily. 60 tablet 2  . traMADol (ULTRAM) 50 MG tablet Take 50 mg by mouth every 6 (six) hours as needed for moderate pain.     . Vitamin D, Ergocalciferol, (DRISDOL) 1.25 MG (50000 UNIT) CAPS capsule Take 50,000 Units by mouth 2 (two) times a week. Mondays and Fridays     No current facility-administered medications on file prior to visit.    ALLERGIES: Allergies  Allergen Reactions  . Penicillins     Childhood allergy    FAMILY HISTORY: Family History  Problem Relation Age of Onset  . Heart attack Mother   . Alcohol abuse Father    SOCIAL HISTORY: Social History   Socioeconomic History  . Marital status: Married    Spouse name: Not on file  . Number of children: Not on file  . Years of education: Not on file  . Highest education level: Not on file  Occupational History  . Not on file  Tobacco Use  . Smoking status: Former Smoker    Types: Cigarettes    Quit date: 2021    Years since quitting: 0.8  . Smokeless tobacco: Former Engineer, water and Sexual Activity  . Alcohol use: Never  . Drug use: Never  . Sexual activity: Not on file  Other Topics Concern  . Not on file  Social History Narrative  . Not on file   Social Determinants of Health   Financial Resource Strain:   . Difficulty of Paying Living Expenses: Not on file  Food Insecurity:   . Worried About Programme researcher, broadcasting/film/video in the Last Year: Not on file  . Ran Out of Food in the Last Year: Not on file  Transportation Needs:   . Lack of Transportation (Medical): Not on file  . Lack of Transportation (Non-Medical): Not on file  Physical Activity:   . Days of Exercise per Week: Not  on file  . Minutes of Exercise per Session: Not on file  Stress:   . Feeling of Stress : Not on file  Social Connections:   . Frequency of Communication with Friends and Family: Not on file  . Frequency of Social Gatherings with Friends and Family: Not on file  . Attends Religious Services: Not on file  . Active Member of Clubs or Organizations: Not on file  . Attends Banker Meetings: Not on file  . Marital Status: Not on file  Intimate Partner Violence:   . Fear of Current  or Ex-Partner: Not on file  . Emotionally Abused: Not on file  . Physically Abused: Not on file  . Sexually Abused: Not on file    Objective:  BP 160/82, Height 5\' 1"  (1.549 m), weight 240 lb (108.9 kg), SpO2 97 % Orthostatic vitals negative. General: No acute distress.  Patient appears well-groomed.   Head:  Normocephalic/atraumatic Eyes:  fundi examined but not visualized Neck: supple, no paraspinal tenderness, full range of motion Back: No paraspinal tenderness Heart: regular rate and rhythm Lungs: Clear to auscultation bilaterally. Vascular: No carotid bruits. Neurological Exam: Mental status: alert and oriented to person, place, and time, recent and remote memory intact, fund of knowledge intact, attention and concentration intact, speech fluent and not dysarthric, language intact. Cranial nerves: CN I: not tested CN II: pupils equal, round and reactive to light, visual fields intact CN III, IV, VI:  full range of motion, no nystagmus, no ptosis CN V: facial sensation intact. CN VII: upper and lower face symmetric CN VIII: hearing intact CN IX, X: gag intact, uvula midline CN XI: sternocleidomastoid and trapezius muscles intact CN XII: tongue midline Bulk & Tone: normal, no fasciculations. Motor:  muscle strength 5/5 throughout Sensation:  Pinprick, temperature and vibratory sensation intact. Deep Tendon Reflexes:  2+ throughout,  toes downgoing.   Finger to nose testing:  Without  dysmetria.   Heel to shin:  Without dysmetria.   Gait:  Normal station and stride.  Romberg negative.  Assessment/Plan:   Dizziness.  He has features of inner ear dysfunction as well as vasovagal etiology.  I do not think this is related to concussion.  His symptoms of dyspnea, diaphoresis and near syncope do not correlate.  I do not suspect an  Intracranial etiology, however we will check CTA of head to evaluate the vertebrobasilar system.    03/07/2020 ADDENDUM:  CTA of head on 03/06/2020 reviewed, which is normal.  No evidence of vertebrobasilar insufficiency.  I do not have neurologic explanation for his symptoms.  Advised to follow up with PCP.   03/08/2020, DO  CC:  Shon Millet, NP  Clelia Croft, DO

## 2020-02-14 ENCOUNTER — Ambulatory Visit: Payer: Commercial Managed Care - PPO | Admitting: Neurology

## 2020-02-14 ENCOUNTER — Encounter: Payer: Self-pay | Admitting: Neurology

## 2020-02-14 ENCOUNTER — Other Ambulatory Visit: Payer: Self-pay

## 2020-02-14 VITALS — Ht 61.0 in | Wt 240.0 lb

## 2020-02-14 DIAGNOSIS — G450 Vertebro-basilar artery syndrome: Secondary | ICD-10-CM | POA: Diagnosis not present

## 2020-02-14 DIAGNOSIS — R42 Dizziness and giddiness: Secondary | ICD-10-CM | POA: Diagnosis not present

## 2020-02-14 NOTE — Patient Instructions (Signed)
At this time, I don't see a neurologic cause for your dizziness.  We will check a CT of the blood vessels in your head.  From a neurologic standpoint, I do not see a reason why you can't go back to work but I can't clear you as I do not have a specific neurologic cause for your symptoms

## 2020-03-04 ENCOUNTER — Ambulatory Visit (INDEPENDENT_AMBULATORY_CARE_PROVIDER_SITE_OTHER): Payer: Commercial Managed Care - PPO | Admitting: Otolaryngology

## 2020-03-06 ENCOUNTER — Ambulatory Visit
Admission: RE | Admit: 2020-03-06 | Discharge: 2020-03-06 | Disposition: A | Discharge status: Home / Self Care | Payer: Commercial Managed Care - PPO | Source: Ambulatory Visit | Attending: Neurology | Admitting: Neurology

## 2020-03-06 ENCOUNTER — Other Ambulatory Visit: Payer: Self-pay

## 2020-03-06 DIAGNOSIS — G450 Vertebro-basilar artery syndrome: Secondary | ICD-10-CM

## 2020-03-06 DIAGNOSIS — R42 Dizziness and giddiness: Secondary | ICD-10-CM

## 2020-03-06 MED ORDER — IOPAMIDOL (ISOVUE-370) INJECTION 76%
75.0000 mL | Freq: Once | INTRAVENOUS | Status: AC | PRN
  Administered 2020-03-06: 12:00:00 75 mL via INTRAVENOUS

## 2020-03-07 ENCOUNTER — Ambulatory Visit (INDEPENDENT_AMBULATORY_CARE_PROVIDER_SITE_OTHER): Payer: Commercial Managed Care - PPO | Admitting: Otolaryngology

## 2020-03-07 VITALS — Temp 97.9°F

## 2020-03-07 DIAGNOSIS — R42 Dizziness and giddiness: Secondary | ICD-10-CM

## 2020-03-07 DIAGNOSIS — H903 Sensorineural hearing loss, bilateral: Secondary | ICD-10-CM

## 2020-03-07 NOTE — Progress Notes (Signed)
HPI: Austin Davenport is a 64 y.o. male who presents is referred by Dr. Kerby Less me for evaluation of chronic dizziness. This apparently began after he had a blacking out spell back in August of this year where he fell back and hit his head. He is not sure what caused him to blackout. But since that time he has had chronic problems with dizziness and what he describes as intermittent vertigo or spinning. He has not noted any big change in his hearing but he is always has some hearing loss. He states that he had a hearing test performed about 2 years ago and they recommended hearing aids for him at that time but could not afford them. He states that his hearing comes and goes. He also has notices a high-pitched ringing in his ears. He has had no pain or drainage from the ears..  Past Medical History:  Diagnosis Date  . Abnormal electrocardiogram 06/08/2019  . Depressed left ventricular ejection fraction 07/12/2019  . Diarrhea   . Dizziness   . Erectile dysfunction   . Essential hypertension 06/08/2019  . LBBB (left bundle branch block) 06/08/2019  . Left bundle branch block   . Mixed hyperlipidemia   . Morbid obesity (HCC) 06/08/2019  . Pain in right foot   . Pain in right knee   . Pre-operative cardiovascular examination 07/12/2019  . Testicular hypofunction   . Unilateral primary osteoarthritis, right knee   . Vitamin D deficiency    Past Surgical History:  Procedure Laterality Date  . CARPAL TUNNEL RELEASE  2009  . KIDNEY STONE SURGERY  2019  . LEFT HEART CATH AND CORONARY ANGIOGRAPHY N/A 12/20/2019   Procedure: LEFT HEART CATH AND CORONARY ANGIOGRAPHY;  Surgeon: Lyn Records, MD;  Location: [facility];  Service: Cardiovascular;  Laterality: N/A;  . skin biopsy  2009   Social History   Socioeconomic History  . Marital status: Married    Spouse name: Not on file  . Number of children: Not on file  . Years of education: Not on file  . Highest education level: Not on file   Occupational History  . Not on file  Tobacco Use  . Smoking status: Former Smoker    Types: Cigarettes    Quit date: 2021    Years since quitting: 0.9  . Smokeless tobacco: Former Engineer, water and Sexual Activity  . Alcohol use: Never  . Drug use: Never  . Sexual activity: Not on file  Other Topics Concern  . Not on file  Social History Narrative   Right handed   Social Determinants of Health   Financial Resource Strain: Not on file  Food Insecurity: Not on file  Transportation Needs: Not on file  Physical Activity: Not on file  Stress: Not on file  Social Connections: Not on file   Family History  Problem Relation Age of Onset  . Heart attack Mother   . Alcohol abuse Father    Allergies  Allergen Reactions  . Penicillins     Childhood allergy   Prior to Admission medications   Medication Sig Start Date End Date Taking? Authorizing Provider  albuterol (VENTOLIN HFA) 108 (90 Base) MCG/ACT inhaler Inhale 1 puff into the lungs every 6 (six) hours as needed for wheezing.  11/08/19   [provider]  aspirin EC 81 MG tablet Take 81 mg by mouth daily.    [provider]  atorvastatin (LIPITOR) 40 MG tablet Take 40 mg by mouth  at bedtime.  Patient not taking: Reported on 02/14/2020    [provider]  Azelastine-Fluticasone 137-50 MCG/ACT SUSP Place 1 spray into both nostrils daily. 11/07/19   [provider]  diclofenac Sodium (VOLTAREN) 1 % GEL Apply 1 application topically 2 (two) times daily as needed (pain).     [provider]  furosemide (LASIX) 40 MG tablet Take 1 tablet (40 mg total) by mouth 2 (two) times daily. 01/02/20   Tobb, Kardie, DO  hydrochlorothiazide (MICROZIDE) 12.5 MG capsule TAKE 1 CAPSULE BY MOUTH EVERY DAY Patient not taking: Reported on 02/14/2020 11/24/19   Tobb, Kardie, DO  isosorbide mononitrate (IMDUR) 30 MG 24 hr tablet Take 0.5 tablets (15 mg total) by mouth daily. 01/02/20 04/01/20  Tobb, Kardie, DO   meclizine (ANTIVERT) 25 MG tablet Take 50 mg by mouth daily.     [provider]  meloxicam (MOBIC) 15 MG tablet Take 15 mg by mouth daily. 11/12/19   [provider]  metoprolol tartrate (LOPRESSOR) 25 MG tablet Take 25 mg by mouth 2 (two) times daily. Patient not taking: Reported on 02/14/2020 11/16/19   [provider]  potassium chloride SA (KLOR-CON) 20 MEQ tablet Take 1 tablet (20 mEq total) by mouth 2 (two) times daily. 01/02/20   Tobb, Kardie, DO  traMADol (ULTRAM) 50 MG tablet Take 50 mg by mouth every 6 (six) hours as needed for moderate pain.  11/24/19   [provider]  Vitamin D, Ergocalciferol, (DRISDOL) 1.25 MG (50000 UNIT) CAPS capsule Take 50,000 Units by mouth 2 (two) times a week. Mondays and Fridays    [provider]     Positive ROS: Otherwise negative  All other systems have been reviewed and were otherwise negative with the exception of those mentioned in the HPI and as above.  Physical Exam: Constitutional: Alert, well-appearing, no acute distress. He walks with a cane. Ears: External ears without lesions or tenderness. Ear canals are clear bilaterally. TMs are clear bilaterally with good mobility on pneumatic otoscopy. Hearing screening with a tuning forks revealed a mild hearing loss in both ears with AC > BC bilaterally. Dix-Hallpike testing revealed no evidence of BPPV. Nasal: External nose without lesions. Septum midline with mild rhinitis. Clear nasal passages bilaterally. Oral: Lips and gums without lesions. Tongue and palate mucosa without lesions. Posterior oropharynx clear. Neck: No palpable adenopathy or masses Respiratory: Breathing comfortably  Skin: No facial/neck lesions or rash noted.  Procedures  Assessment: Dizziness probably related to concussion when he had a fall 5 months ago.  Plan: We will go ahead and schedule him for VNG testing and audiologic testing to evaluate inner ear function. He is  apparently already following up with neurology. He will follow-up here following completion of VNG and audiologic testing.   Narda Bonds, MD   CC:

## 2020-03-17 ENCOUNTER — Other Ambulatory Visit: Payer: Self-pay | Admitting: Cardiology

## 2020-04-15 ENCOUNTER — Other Ambulatory Visit: Payer: Self-pay | Admitting: Cardiology

## 2020-04-15 NOTE — Telephone Encounter (Signed)
Refill sent to pharmacy.   

## 2020-04-22 ENCOUNTER — Ambulatory Visit (INDEPENDENT_AMBULATORY_CARE_PROVIDER_SITE_OTHER): Payer: Commercial Managed Care - PPO | Admitting: Otolaryngology

## 2020-04-22 ENCOUNTER — Other Ambulatory Visit: Payer: Self-pay

## 2020-04-22 VITALS — Temp 97.5°F

## 2020-04-22 DIAGNOSIS — R42 Dizziness and giddiness: Secondary | ICD-10-CM

## 2020-04-22 DIAGNOSIS — H903 Sensorineural hearing loss, bilateral: Secondary | ICD-10-CM

## 2020-04-22 NOTE — Progress Notes (Signed)
HPI: Austin Davenport is a 65 y.o. male who returns today for evaluation of chronic dizziness following head trauma in August of last year.  He presents today with his son.  I reviewed the ENG testing with the patient as well as his son.  This showed abnormal changes consistent with probable CNS involvement as well as nonlocalized peripheral disease.  There was no evidence of BPPV on Dix-Hallpike testing.  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  SRT's were 20 and 25 dB on the right and left side.  He had type A tympanograms bilaterally..  Past Medical History:  Diagnosis Date  . Abnormal electrocardiogram 06/08/2019  . Depressed left ventricular ejection fraction 07/12/2019  . Diarrhea   . Dizziness   . Erectile dysfunction   . Essential hypertension 06/08/2019  . LBBB (left bundle branch block) 06/08/2019  . Left bundle branch block   . Mixed hyperlipidemia   . Morbid obesity (HCC) 06/08/2019  . Pain in right foot   . Pain in right knee   . Pre-operative cardiovascular examination 07/12/2019  . Testicular hypofunction   . Unilateral primary osteoarthritis, right knee   . Vitamin D deficiency    Past Surgical History:  Procedure Laterality Date  . CARPAL TUNNEL RELEASE  2009  . KIDNEY STONE SURGERY  2019  . LEFT HEART CATH AND CORONARY ANGIOGRAPHY N/A 12/20/2019   Procedure: LEFT HEART CATH AND CORONARY ANGIOGRAPHY;  Surgeon: Lyn Records, MD;  Location: [facility];  Service: Cardiovascular;  Laterality: N/A;  . skin biopsy  2009   Social History   Socioeconomic History  . Marital status: Married    Spouse name: Not on file  . Number of children: Not on file  . Years of education: Not on file  . Highest education level: Not on file  Occupational History  . Not on file  Tobacco Use  . Smoking status: Former Smoker    Types:  Cigarettes    Quit date: 2021    Years since quitting: 1.0  . Smokeless tobacco: Former Engineer, water and Sexual Activity  . Alcohol use: Never  . Drug use: Never  . Sexual activity: Not on file  Other Topics Concern  . Not on file  Social History Narrative   Right handed   Social Determinants of Health   Financial Resource Strain: Not on file  Food Insecurity: Not on file  Transportation Needs: Not on file  Physical Activity: Not on file  Stress: Not on file  Social Connections: Not on file   Family History  Problem Relation Age of Onset  . Heart attack Mother   . Alcohol abuse Father    Allergies  Allergen Reactions  . Penicillins     Childhood allergy   Prior to Admission medications   Medication Sig Start Date End Date Taking? Authorizing Provider  albuterol (VENTOLIN HFA) 108 (90 Base) MCG/ACT inhaler Inhale 1 puff into the lungs every 6 (six) hours as needed for wheezing.  11/08/19   [provider]  aspirin EC 81 MG tablet Take 81 mg by mouth daily.    [provider]  atorvastatin (LIPITOR) 40 MG tablet Take 40 mg by mouth at bedtime.  Patient not taking: Reported on 02/14/2020    [provider]  Azelastine-Fluticasone 137-50 MCG/ACT SUSP Place 1 spray into both nostrils daily. 11/07/19  [provider]  diclofenac Sodium (VOLTAREN) 1 % GEL Apply 1 application topically 2 (two) times daily as needed (pain).     [provider]  furosemide (LASIX) 40 MG tablet TAKE 1 TABLET BY MOUTH TWICE A DAY 04/15/20   Tobb, Kardie, DO  hydrochlorothiazide (MICROZIDE) 12.5 MG capsule TAKE 1 CAPSULE BY MOUTH EVERY DAY Patient not taking: Reported on 02/14/2020 11/24/19   Tobb, Kardie, DO  isosorbide mononitrate (IMDUR) 30 MG 24 hr tablet Take 0.5 tablets (15 mg total) by mouth daily. 01/02/20 04/01/20  Tobb, Kardie, DO  KLOR-CON M20 20 MEQ tablet TAKE 1 TABLET BY MOUTH TWICE A DAY 03/18/20   Tobb, Kardie, DO  meclizine (ANTIVERT) 25 MG  tablet Take 50 mg by mouth daily.     [provider]  meloxicam (MOBIC) 15 MG tablet Take 15 mg by mouth daily. 11/12/19   [provider]  metoprolol tartrate (LOPRESSOR) 25 MG tablet Take 25 mg by mouth 2 (two) times daily. Patient not taking: Reported on 02/14/2020 11/16/19   [provider]  traMADol (ULTRAM) 50 MG tablet Take 50 mg by mouth every 6 (six) hours as needed for moderate pain.  11/24/19   [provider]  Vitamin D, Ergocalciferol, (DRISDOL) 1.25 MG (50000 UNIT) CAPS capsule Take 50,000 Units by mouth 2 (two) times a week. Mondays and Fridays    [provider]     Positive ROS: Otherwise negative  All other systems have been reviewed and were otherwise negative with the exception of those mentioned in the HPI and as above.  Physical Exam: Constitutional: Alert, well-appearing, no acute distress Ears: External ears without lesions or tenderness. Ear canals are clear bilaterally with intact, clear TMs.  Nasal: External nose without lesions.. Clear nasal passages Oral: Lips and gums without lesions. Tongue and palate mucosa without lesions. Posterior oropharynx clear. Neck: No palpable adenopathy or masses Respiratory: Breathing comfortably  Skin: No facial/neck lesions or rash noted.  Procedures  Assessment: Reviewed the EMG testing with the patient as well as his son.  Could consider further evaluation with neurology although patient really is not interested in seeing neurology.  Also discussed with them concerning vestibular rehab but patient is not wanting to visit vestibular rehab and would rather do the rehab himself.  Plan: Briefly discussed vestibular rehab with him as far as exercise and weight loss which will help with his balance in the long run.  Cautioned him about exercise with someone so he does not injure himself.  Recommended vestibular rehab with rehab specialist but patient is not interested in having this  scheduled. Also discussed with him concerning possible neurology evaluation and would recommend proceeding with this if his dizziness does not improve.  He is not interested in seeing neurology at this time.   Narda Bonds, MD

## 2020-04-24 ENCOUNTER — Encounter (INDEPENDENT_AMBULATORY_CARE_PROVIDER_SITE_OTHER): Payer: Self-pay

## 2020-05-15 ENCOUNTER — Ambulatory Visit: Payer: Commercial Managed Care - PPO | Admitting: Cardiology

## 2021-05-07 DIAGNOSIS — E782 Mixed hyperlipidemia: Secondary | ICD-10-CM | POA: Diagnosis not present

## 2021-05-07 DIAGNOSIS — E119 Type 2 diabetes mellitus without complications: Secondary | ICD-10-CM | POA: Diagnosis not present

## 2021-05-07 DIAGNOSIS — E1165 Type 2 diabetes mellitus with hyperglycemia: Secondary | ICD-10-CM | POA: Diagnosis not present

## 2021-05-07 DIAGNOSIS — I10 Essential (primary) hypertension: Secondary | ICD-10-CM | POA: Diagnosis not present

## 2021-05-07 DIAGNOSIS — E038 Other specified hypothyroidism: Secondary | ICD-10-CM | POA: Diagnosis not present

## 2021-05-07 DIAGNOSIS — E785 Hyperlipidemia, unspecified: Secondary | ICD-10-CM | POA: Diagnosis not present

## 2021-05-07 DIAGNOSIS — D518 Other vitamin B12 deficiency anemias: Secondary | ICD-10-CM | POA: Diagnosis not present

## 2021-05-07 DIAGNOSIS — Z8679 Personal history of other diseases of the circulatory system: Secondary | ICD-10-CM | POA: Diagnosis not present

## 2021-05-07 DIAGNOSIS — E559 Vitamin D deficiency, unspecified: Secondary | ICD-10-CM | POA: Diagnosis not present

## 2021-05-07 DIAGNOSIS — M1711 Unilateral primary osteoarthritis, right knee: Secondary | ICD-10-CM | POA: Diagnosis not present

## 2021-05-07 DIAGNOSIS — Z7689 Persons encountering health services in other specified circumstances: Secondary | ICD-10-CM | POA: Diagnosis not present

## 2021-05-14 DIAGNOSIS — E1169 Type 2 diabetes mellitus with other specified complication: Secondary | ICD-10-CM | POA: Diagnosis not present

## 2021-05-14 DIAGNOSIS — I10 Essential (primary) hypertension: Secondary | ICD-10-CM | POA: Diagnosis not present

## 2021-05-14 DIAGNOSIS — Z6841 Body Mass Index (BMI) 40.0 and over, adult: Secondary | ICD-10-CM | POA: Diagnosis not present

## 2021-05-14 DIAGNOSIS — E785 Hyperlipidemia, unspecified: Secondary | ICD-10-CM | POA: Diagnosis not present

## 2021-06-09 DIAGNOSIS — E782 Mixed hyperlipidemia: Secondary | ICD-10-CM | POA: Diagnosis not present

## 2021-06-09 DIAGNOSIS — E559 Vitamin D deficiency, unspecified: Secondary | ICD-10-CM | POA: Diagnosis not present

## 2021-06-09 DIAGNOSIS — D518 Other vitamin B12 deficiency anemias: Secondary | ICD-10-CM | POA: Diagnosis not present

## 2021-06-09 DIAGNOSIS — M1711 Unilateral primary osteoarthritis, right knee: Secondary | ICD-10-CM | POA: Diagnosis not present

## 2021-06-09 DIAGNOSIS — E119 Type 2 diabetes mellitus without complications: Secondary | ICD-10-CM | POA: Diagnosis not present

## 2021-06-09 DIAGNOSIS — I10 Essential (primary) hypertension: Secondary | ICD-10-CM | POA: Diagnosis not present

## 2021-06-09 DIAGNOSIS — E038 Other specified hypothyroidism: Secondary | ICD-10-CM | POA: Diagnosis not present

## 2021-06-18 DIAGNOSIS — E662 Morbid (severe) obesity with alveolar hypoventilation: Secondary | ICD-10-CM | POA: Diagnosis not present

## 2021-06-18 DIAGNOSIS — J4520 Mild intermittent asthma, uncomplicated: Secondary | ICD-10-CM | POA: Diagnosis not present

## 2021-06-18 DIAGNOSIS — G4733 Obstructive sleep apnea (adult) (pediatric): Secondary | ICD-10-CM | POA: Diagnosis not present

## 2021-06-18 DIAGNOSIS — R5383 Other fatigue: Secondary | ICD-10-CM | POA: Diagnosis not present

## 2021-08-05 DIAGNOSIS — Z125 Encounter for screening for malignant neoplasm of prostate: Secondary | ICD-10-CM | POA: Diagnosis not present

## 2021-08-05 DIAGNOSIS — E1169 Type 2 diabetes mellitus with other specified complication: Secondary | ICD-10-CM | POA: Diagnosis not present

## 2021-08-13 DIAGNOSIS — R918 Other nonspecific abnormal finding of lung field: Secondary | ICD-10-CM | POA: Diagnosis not present

## 2021-08-13 DIAGNOSIS — J9811 Atelectasis: Secondary | ICD-10-CM | POA: Diagnosis not present

## 2021-08-19 DIAGNOSIS — Z23 Encounter for immunization: Secondary | ICD-10-CM | POA: Diagnosis not present

## 2021-08-19 DIAGNOSIS — G4733 Obstructive sleep apnea (adult) (pediatric): Secondary | ICD-10-CM | POA: Diagnosis not present

## 2021-08-19 DIAGNOSIS — R5383 Other fatigue: Secondary | ICD-10-CM | POA: Diagnosis not present

## 2021-08-19 DIAGNOSIS — E785 Hyperlipidemia, unspecified: Secondary | ICD-10-CM | POA: Diagnosis not present

## 2021-08-19 DIAGNOSIS — I10 Essential (primary) hypertension: Secondary | ICD-10-CM | POA: Diagnosis not present

## 2021-08-19 DIAGNOSIS — M25561 Pain in right knee: Secondary | ICD-10-CM | POA: Diagnosis not present

## 2021-08-19 DIAGNOSIS — J4520 Mild intermittent asthma, uncomplicated: Secondary | ICD-10-CM | POA: Diagnosis not present

## 2021-08-19 DIAGNOSIS — E1169 Type 2 diabetes mellitus with other specified complication: Secondary | ICD-10-CM | POA: Diagnosis not present

## 2021-11-05 DIAGNOSIS — I10 Essential (primary) hypertension: Secondary | ICD-10-CM | POA: Diagnosis not present

## 2021-11-05 DIAGNOSIS — E1169 Type 2 diabetes mellitus with other specified complication: Secondary | ICD-10-CM | POA: Diagnosis not present

## 2021-11-12 DIAGNOSIS — Z139 Encounter for screening, unspecified: Secondary | ICD-10-CM | POA: Diagnosis not present

## 2021-11-12 DIAGNOSIS — Z6841 Body Mass Index (BMI) 40.0 and over, adult: Secondary | ICD-10-CM | POA: Diagnosis not present

## 2021-11-12 DIAGNOSIS — E1169 Type 2 diabetes mellitus with other specified complication: Secondary | ICD-10-CM | POA: Diagnosis not present

## 2021-11-12 DIAGNOSIS — E785 Hyperlipidemia, unspecified: Secondary | ICD-10-CM | POA: Diagnosis not present

## 2021-11-12 DIAGNOSIS — L570 Actinic keratosis: Secondary | ICD-10-CM | POA: Diagnosis not present

## 2021-11-12 DIAGNOSIS — I10 Essential (primary) hypertension: Secondary | ICD-10-CM | POA: Diagnosis not present

## 2021-11-12 DIAGNOSIS — Z1331 Encounter for screening for depression: Secondary | ICD-10-CM | POA: Diagnosis not present

## 2021-11-12 DIAGNOSIS — Z0000 Encounter for general adult medical examination without abnormal findings: Secondary | ICD-10-CM | POA: Diagnosis not present

## 2021-11-26 ENCOUNTER — Ambulatory Visit: Payer: Medicare HMO | Attending: Family Medicine | Admitting: Physical Therapy

## 2021-11-26 ENCOUNTER — Encounter: Payer: Self-pay | Admitting: Physical Therapy

## 2021-11-26 DIAGNOSIS — R2689 Other abnormalities of gait and mobility: Secondary | ICD-10-CM | POA: Diagnosis not present

## 2021-11-26 DIAGNOSIS — R42 Dizziness and giddiness: Secondary | ICD-10-CM | POA: Diagnosis not present

## 2021-11-26 NOTE — Therapy (Addendum)
OUTPATIENT PHYSICAL THERAPY VESTIBULAR EVALUATION     Patient Name: Austin Davenport MRN: [id number] DOB:1955/03/27, 66 y.o., male Today's Date: 11/26/2021  PCP: Clelia Croft NP REFERRING PROVIDER: Lezlie Lye, Darcel Bayley MD   PT End of Session - 11/26/21 1253     Visit Number 1    Number of Visits 6    Date for PT Re-Evaluation 01/07/22    Authorization Type [employer]    Progress Note Due on Visit 10    PT Start Time 1145    PT Stop Time 1230    PT Time Calculation (min) 45 min    Activity Tolerance Patient tolerated treatment well    Behavior During Therapy [facility] for tasks assessed/performed             Past Medical History:  Diagnosis Date   Abnormal electrocardiogram 06/08/2019   Depressed left ventricular ejection fraction 07/12/2019   Diarrhea    Dizziness    Erectile dysfunction    Essential hypertension 06/08/2019   LBBB (left bundle branch block) 06/08/2019   Left bundle branch block    Mixed hyperlipidemia    Morbid obesity (HCC) 06/08/2019   Pain in right foot    Pain in right knee    Pre-operative cardiovascular examination 07/12/2019   Testicular hypofunction    Unilateral primary osteoarthritis, right knee    Vitamin D deficiency    Past Surgical History:  Procedure Laterality Date   CARPAL TUNNEL RELEASE  2009   KIDNEY STONE SURGERY  2019   LEFT HEART CATH AND CORONARY ANGIOGRAPHY N/A 12/20/2019   Procedure: LEFT HEART CATH AND CORONARY ANGIOGRAPHY;  Surgeon: Lyn Records, MD;  Location: [facility];  Service: Cardiovascular;  Laterality: N/A;   skin biopsy  2009   Patient Active Problem List   Diagnosis Date Noted   Coronary artery disease involving native coronary artery of native heart without angina pectoris 02/06/2020   Abnormal nuclear cardiac imaging test    Shortness of breath    Depressed left ventricular ejection fraction 07/12/2019   Pre-operative cardiovascular examination 07/12/2019   Abnormal electrocardiogram 06/08/2019    LBBB (left bundle branch block) 06/08/2019   Essential hypertension 06/08/2019   Morbid obesity (HCC) 06/08/2019   Vitamin D deficiency    Unilateral primary osteoarthritis, right knee    Testicular hypofunction    Pain in right knee    Pain in right foot    Mixed hyperlipidemia    Left bundle branch block    Erectile dysfunction    Dizziness    Diarrhea     ONSET DATE: Aug 2021  REFERRING DIAG: R42 (ICD-10-CM) - Dizziness and giddiness   THERAPY DIAG:  No diagnosis found.  Rationale for Evaluation and Treatment Rehabilitation  SUBJECTIVE:   SUBJECTIVE STATEMENT: Pt reports fall in August 2021. Pt states he fell out of the car and was down for a prolonged unknown period of time. After that he got a work up. Pt had a heart attack but didn't feel it. Pt states his problem is that when he gets up from the chair real fast he gets dizzy and falls. Unable to sleep with his face up -- he feels like his vision goes black. "Head too low I get dizzy." Pt accompanied by: self  PERTINENT HISTORY: Fall with hematoma in Aug 2021   PAIN:  Are you having pain? No  PRECAUTIONS: Fall  WEIGHT BEARING RESTRICTIONS No  FALLS: Has patient fallen in last 6 months? No  LIVING  ENVIRONMENT: Lives with: lives with their spouse and lives with their son Lives in: House/apartment Stairs: No Has following equipment at home: Single point cane  PLOF: Independent  PATIENT GOALS Improve dizziness for bed mobility and car transfers  OBJECTIVE:    COGNITION: Overall cognitive status: Within functional limits for tasks assessed   SENSATION: WFL  POSTURE: rounded shoulders and increased thoracic kyphosis   Cervical ROM:    Active A/PROM (deg) eval  Flexion WFL  Extension WFL  Right lateral flexion WFL  Left lateral flexion WFL  Right rotation WFL  Left rotation WFL  (Blank rows = not tested)  STRENGTH: WFL   BED MOBILITY:  Sit to supine Min A Supine to sit Min  A  TRANSFERS: Assistive device utilized: Single point cane  Sit to stand: Complete Independence Stand to sit: Complete Independence  GAIT: Gait pattern: step through pattern, decreased stance time- Left, decreased ankle dorsiflexion- Right, decreased ankle dorsiflexion- Left, and lateral hip instability Distance walked: 100' Assistive device utilized: Single point cane Level of assistance: Modified independence Comments: Mildly antalgic    VESTIBULAR ASSESSMENT   GENERAL OBSERVATION: Uses cane for amb    SYMPTOM BEHAVIOR:   Subjective history: See subjective above   Non-Vestibular symptoms:  Cold, sweaty, "my vision goes black", tinnitus intermittently   Type of dizziness: Imbalance (Disequilibrium), Spinning/Vertigo, and Lightheadedness/Faint   Frequency: Moving really fast; worst when he goes to bed   Duration: Lasts a few minutes   Aggravating factors: Induced by position change: supine to sit and sit to stand and Induced by motion: turning head quickly   Relieving factors: head stationary, rest, and slow movements   Progression of symptoms: unchanged   OCULOMOTOR EXAM:   Ocular Alignment: normal   Ocular ROM: No Limitations   Spontaneous Nystagmus: absent   Gaze-Induced Nystagmus: absent   Smooth Pursuits: intact   Saccades: intact     VESTIBULAR - OCULAR REFLEX:    Slow VOR: Normal   VOR Cancellation: Comment: Minor nystagmus notable with L head turn   Head-Impulse Test: HIT Right: negative HIT Left: negative    POSITIONAL TESTING:  Right Sidelying: upbeating, right nystagmus Left Sidelying:              Other: Pt diaphoretic and eyes tearing up with face reddening during R sidelying Dix-Hallpike Horizontal canalith testing R & L (-)    MOTION SENSITIVITY:    Motion Sensitivity Quotient  Intensity: 0 = none, 1 = Lightheaded, 2 = Mild, 3 = Moderate, 4 = Severe, 5 = Vomiting  Intensity  1. Sitting to supine   2. Supine to L side   3. Supine to R side    4. Supine to sitting   5. L Hallpike-Dix   6. Up from L    7. R Hallpike-Dix   8. Up from R    9. Sitting, head  tipped to L knee   10. Head up from L  knee   11. Sitting, head  tipped to R knee   12. Head up from R  knee   13. Sitting head turns x5   14.Sitting head nods x5   15. In stance, 180  turn to L    16. In stance, 180  turn to R       VESTIBULAR TREATMENT:  Canalith Repositioning:   Semont Right Posterior: Number of Reps: 2 and Response to Treatment: symptoms improved  PATIENT EDUCATION: Education details: Exam findings, POC, canalith repositioning Person educated:  Patient Education method: Explanation, Demonstration, Verbal cues, and Handouts Education comprehension: verbalized understanding, returned demonstration, and needs further education   GOALS: Goals reviewed with patient? Yes   LONG TERM GOALS: Target date: 01/07/2022    Pt will be able to perform canalith repositioning at home with family assist Baseline:  Goal status: INITIAL  2.  Pt will have no s/s of BPPV with canalith repositioning to demo resolution Baseline:  Goal status: INITIAL  3.  Pt will report no dizziness with bed mobility and car transfers Baseline:  Goal status: INITIAL   ASSESSMENT:  CLINICAL IMPRESSION: Patient is a 66 y.o. M who was seen today for physical therapy evaluation and treatment for dizziness. Assessment significant for (+) R posterior canalithiasis with sidelying Dix-Hallpike. Pt with intense parasympathetic nervous response during testing. Pt with difficulty tolerating Dix-Hallpike due to back pain. Performed 2 Semont maneuvers this session with improved symptoms. Discussed performing this at home.    OBJECTIVE IMPAIRMENTS Abnormal gait, decreased balance, dizziness, and obesity.   ACTIVITY LIMITATIONS transfers, bed mobility, and locomotion level  PARTICIPATION LIMITATIONS: driving and community activity  PERSONAL FACTORS Age and Past/current  experiences are also affecting patient's functional outcome.   REHAB POTENTIAL: Good  CLINICAL DECISION MAKING: Evolving/moderate complexity  EVALUATION COMPLEXITY: Moderate   PLAN: PT FREQUENCY: 1x/week  PT DURATION: 6 weeks  PLANNED INTERVENTIONS: Therapeutic exercises, Therapeutic activity, Neuromuscular re-education, Balance training, Gait training, Patient/Family education, Self Care, Joint mobilization, Vestibular training, Canalith repositioning, Electrical stimulation, Cryotherapy, Moist heat, and Manual therapy  PLAN FOR NEXT SESSION: Reassess for BPPV. Canalith repositioning as needed. Initiate VOR as needed.   Zanaiya Calabria April Dell Ponto, PT, DPT 11/26/2021, 12:53 PM

## 2021-11-27 NOTE — Addendum Note (Signed)
Addended by: Jules Husbands MARIE L on: 11/27/2021 09:04 AM   Modules accepted: Orders

## 2021-12-03 ENCOUNTER — Ambulatory Visit: Payer: Medicare HMO | Admitting: Physical Therapy

## 2021-12-03 ENCOUNTER — Encounter: Payer: Self-pay | Admitting: Physical Therapy

## 2021-12-03 DIAGNOSIS — R2689 Other abnormalities of gait and mobility: Secondary | ICD-10-CM | POA: Diagnosis not present

## 2021-12-03 DIAGNOSIS — R42 Dizziness and giddiness: Secondary | ICD-10-CM

## 2021-12-03 NOTE — Therapy (Addendum)
OUTPATIENT PHYSICAL THERAPY TREATMENT AND DISCHARGE  PHYSICAL THERAPY DISCHARGE SUMMARY  Visits from Start of Care: 2  Current functional level related to goals / functional outcomes: See below   Remaining deficits: See below   Education / Equipment: See below   Patient agrees to discharge. Patient goals were partially met. Patient is being discharged due to not returning since the last visit.    Patient Name: Austin Davenport MRN: [id number] DOB:1955-08-14, 66 y.o., male Today's Date: 12/03/2021  PCP: Irven Shelling NP REFERRING PROVIDER: Jacelyn Pi, Benay Spice MD   PT End of Session - 12/03/21 1131     Visit Number 2    Number of Visits 6    Date for PT Re-Evaluation 01/07/22    Authorization Type [employer]    Progress Note Due on Visit 10    PT Start Time 1145    PT Stop Time 1230    PT Time Calculation (min) 45 min    Activity Tolerance Patient tolerated treatment well    Behavior During Therapy [facility] for tasks assessed/performed             Past Medical History:  Diagnosis Date   Abnormal electrocardiogram 06/08/2019   Depressed left ventricular ejection fraction 07/12/2019   Diarrhea    Dizziness    Erectile dysfunction    Essential hypertension 06/08/2019   LBBB (left bundle branch block) 06/08/2019   Left bundle branch block    Mixed hyperlipidemia    Morbid obesity ([place]) 06/08/2019   Pain in right foot    Pain in right knee    Pre-operative cardiovascular examination 07/12/2019   Testicular hypofunction    Unilateral primary osteoarthritis, right knee    Vitamin D deficiency    Past Surgical History:  Procedure Laterality Date   CARPAL TUNNEL RELEASE  2009   KIDNEY STONE SURGERY  2019   LEFT HEART CATH AND CORONARY ANGIOGRAPHY N/A 12/20/2019   Procedure: LEFT HEART CATH AND CORONARY ANGIOGRAPHY;  Surgeon: Belva Crome, MD;  Location: [facility];  Service: Cardiovascular;  Laterality: N/A;   skin biopsy  2009   Patient Active Problem  List   Diagnosis Date Noted   Coronary artery disease involving native coronary artery of native heart without angina pectoris 02/06/2020   Abnormal nuclear cardiac imaging test    Shortness of breath    Depressed left ventricular ejection fraction 07/12/2019   Pre-operative cardiovascular examination 07/12/2019   Abnormal electrocardiogram 06/08/2019   LBBB (left bundle branch block) 06/08/2019   Essential hypertension 06/08/2019   Morbid obesity ([place]) 06/08/2019   Vitamin D deficiency    Unilateral primary osteoarthritis, right knee    Testicular hypofunction    Pain in right knee    Pain in right foot    Mixed hyperlipidemia    Left bundle branch block    Erectile dysfunction    Dizziness    Diarrhea     ONSET DATE: Aug 2021  REFERRING DIAG: R42 (ICD-10-CM) - Dizziness and giddiness   THERAPY DIAG:  Dizziness and giddiness  Other abnormalities of gait and mobility  Rationale for Evaluation and Treatment Rehabilitation  SUBJECTIVE:   SUBJECTIVE STATEMENT: 12/03/21: Pt states he can still feel the dizziness but a little bit less. Pt reports he tried to do the maneuver at home but it was too hard to do by himself. Pt reports he can feel it sometimes when coming up from sitting to standing and then still when he feels the heat.  From eval: Pt reports fall in August 2021. Pt states he fell out of the car and was down for a prolonged unknown period of time. After that he got a work up. Pt had a heart attack but didn't feel it. Pt states his problem is that when he gets up from the chair real fast he gets dizzy and falls. Unable to sleep with his face up -- he feels like his vision goes black. "Head too low I get dizzy." Pt accompanied by: self  PERTINENT HISTORY: Fall with hematoma in Aug 2021   PAIN:  Are you having pain? No  PRECAUTIONS: Fall  WEIGHT BEARING RESTRICTIONS No  FALLS: Has patient fallen in last 6 months? No  LIVING ENVIRONMENT: Lives with: lives  with their spouse and lives with their son Lives in: House/apartment Stairs: No Has following equipment at home: Single point cane  PLOF: Independent  PATIENT GOALS Improve dizziness for bed mobility and car transfers  OBJECTIVE:   (FROM EVAL)  COGNITION: Overall cognitive status: Within functional limits for tasks assessed   SENSATION: WFL  POSTURE: rounded shoulders and increased thoracic kyphosis   Cervical ROM:    Active A/PROM (deg) eval  Flexion WFL  Extension WFL  Right lateral flexion WFL  Left lateral flexion WFL  Right rotation WFL  Left rotation WFL  (Blank rows = not tested)  STRENGTH: WFL   BED MOBILITY:  Sit to supine Min A Supine to sit Min A  TRANSFERS: Assistive device utilized: Single point cane  Sit to stand: Complete Independence Stand to sit: Complete Independence  GAIT: Gait pattern: step through pattern, decreased stance time- Left, decreased ankle dorsiflexion- Right, decreased ankle dorsiflexion- Left, and lateral hip instability Distance walked: 100' Assistive device utilized: Single point cane Level of assistance: Modified independence Comments: Mildly antalgic    VESTIBULAR ASSESSMENT    SYMPTOM BEHAVIOR (from eval):   Subjective history: See subjective above   Non-Vestibular symptoms:  Cold, sweaty, "my vision goes black", tinnitus intermittently   Type of dizziness: Imbalance (Disequilibrium), Spinning/Vertigo, and Lightheadedness/Faint   Frequency: Moving really fast; worst when he goes to bed   Duration: Lasts a few minutes   Aggravating factors: Induced by position change: supine to sit and sit to stand and Induced by motion: turning head quickly   Relieving factors: head stationary, rest, and slow movements   Progression of symptoms: unchanged   OCULOMOTOR EXAM (from eval):   Ocular Alignment: normal   Ocular ROM: No Limitations   Spontaneous Nystagmus: absent   Gaze-Induced Nystagmus: absent   Smooth Pursuits:  intact   Saccades: intact     VESTIBULAR - OCULAR REFLEX (from eval):    Slow VOR: Normal   VOR Cancellation: Comment: Minor nystagmus notable with L head turn   Head-Impulse Test: HIT Right: negative HIT Left: negative    POSITIONAL TESTING: 12/03/21 Right sidelying: no nystagmus "feels like it's going to start" Left sidelying: no nystagmus "just slight" Horizontal canalith testing R&L: no symptoms Dix-Hallpike right: no symptoms Dix-Hallpike left: no symptoms   (From eval) Right Sidelying: upbeating, right nystagmus Left Sidelying:              Other: Pt diaphoretic and eyes tearing up with face reddening during R sidelying Dix-Hallpike Horizontal canalith testing R & L (-)   BP Testing: 12/03/21 Supine after ~5 min: 162/94 BP; 70 BPM Initial sup to sit: 124/94 BP; 74 BPM -- pt reports black spots in vision Sitting after ~5 min: 157/90  BP; 69 BPM Initial standing: 147/95 BP; 73 BPM Standing after ~5 min: 140/88 BP; 72 BPM   VESTIBULAR TREATMENT: 12/03/21: Canalith repositioning:  Semont R posterior x 1 rep, no symptoms after rechecking in Dix-hallpike  Semont L posterior x 1 rep, no symptoms after rechecking in Dix-Hallpike   (From eval) Canalith Repositioning:   Semont Right Posterior: Number of Reps: 2 and Response to Treatment: symptoms improved  PATIENT EDUCATION: Education details: Exam findings, POC, canalith repositioning Person educated: Patient Education method: Explanation, Demonstration, Verbal cues, and Handouts Education comprehension: verbalized understanding, returned demonstration, and needs further education   GOALS: Goals reviewed with patient? Yes   LONG TERM GOALS: Target date: 01/07/2022    Pt will be able to perform canalith repositioning at home with family assist Baseline:  Goal status: PARTIALLY MET  2.  Pt will have no s/s of BPPV with canalith repositioning to demo resolution Baseline:  Goal status: MET  3.  Pt will report no  dizziness with bed mobility and car transfers Baseline:  Goal status: PARTIALLY MET   ASSESSMENT:  CLINICAL IMPRESSION: 12/03/21: Did not visualize nystagmus this session nor did pt have parasympathetic response during canalith testing. Some "black spots" noted when performing transfers. Checked pt's BP in different positions. Found pt to get hypotensive when transitioning into more upright positions. Greatest drop SBP by 38 mmHg from supine to sit. Pt has met or partially met his LTGs. No current PT needs at this time. Will keep chart open for a month in case of any exacerbations or issues with BPPV.   (From eval): Patient is a 66 y.o. M who was seen today for physical therapy evaluation and treatment for dizziness. Assessment significant for (+) R posterior canalithiasis with sidelying Dix-Hallpike. Pt with intense parasympathetic nervous response during testing. Pt with difficulty tolerating Dix-Hallpike due to back pain. Performed 2 Semont maneuvers this session with improved symptoms. Discussed performing this at home.    OBJECTIVE IMPAIRMENTS Abnormal gait, decreased balance, dizziness, and obesity.   ACTIVITY LIMITATIONS transfers, bed mobility, and locomotion level  PARTICIPATION LIMITATIONS: driving and community activity  PERSONAL FACTORS Age and Past/current experiences are also affecting patient's functional outcome.   REHAB POTENTIAL: Good  CLINICAL DECISION MAKING: Evolving/moderate complexity  EVALUATION COMPLEXITY: Moderate   PLAN: PT FREQUENCY: 1x/week  PT DURATION: 6 weeks  PLANNED INTERVENTIONS: Therapeutic exercises, Therapeutic activity, Neuromuscular re-education, Balance training, Gait training, Patient/Family education, Self Care, Joint mobilization, Vestibular training, Canalith repositioning, Electrical stimulation, Cryotherapy, Moist heat, and Manual therapy  PLAN FOR NEXT SESSION: Reassess for BPPV. Canalith repositioning as needed. Initiate VOR as  needed.   Zachari Alberta April Ma L Michaline Kindig, PT, DPT 12/03/2021, 11:31 AM

## 2021-12-10 ENCOUNTER — Encounter: Payer: Medicare HMO | Admitting: Physical Therapy

## 2021-12-17 DIAGNOSIS — L570 Actinic keratosis: Secondary | ICD-10-CM | POA: Diagnosis not present

## 2021-12-17 DIAGNOSIS — L578 Other skin changes due to chronic exposure to nonionizing radiation: Secondary | ICD-10-CM | POA: Diagnosis not present

## 2021-12-17 DIAGNOSIS — L820 Inflamed seborrheic keratosis: Secondary | ICD-10-CM | POA: Diagnosis not present

## 2021-12-17 DIAGNOSIS — R233 Spontaneous ecchymoses: Secondary | ICD-10-CM | POA: Diagnosis not present

## 2021-12-17 DIAGNOSIS — L814 Other melanin hyperpigmentation: Secondary | ICD-10-CM | POA: Diagnosis not present

## 2022-01-28 DIAGNOSIS — E1169 Type 2 diabetes mellitus with other specified complication: Secondary | ICD-10-CM | POA: Diagnosis not present

## 2022-01-28 DIAGNOSIS — I10 Essential (primary) hypertension: Secondary | ICD-10-CM | POA: Diagnosis not present

## 2022-02-04 DIAGNOSIS — E1169 Type 2 diabetes mellitus with other specified complication: Secondary | ICD-10-CM | POA: Diagnosis not present

## 2022-02-04 DIAGNOSIS — Z6841 Body Mass Index (BMI) 40.0 and over, adult: Secondary | ICD-10-CM | POA: Diagnosis not present

## 2022-02-04 DIAGNOSIS — I10 Essential (primary) hypertension: Secondary | ICD-10-CM | POA: Diagnosis not present

## 2022-02-04 DIAGNOSIS — E785 Hyperlipidemia, unspecified: Secondary | ICD-10-CM | POA: Diagnosis not present

## 2022-02-04 IMAGING — CT CT ANGIO HEAD
1 of 4 series · 4 of 30 positions shown · IV contrast (iopamidol)
Comparison: Head CT November 15, 2019.

CLINICAL DATA: Dizziness, persistent/recurrent. Cardiac or vascular
cause suspected.

EXAM:
CT ANGIOGRAPHY HEAD
TECHNIQUE: Multidetector CT imaging of the head was performed using the
standard protocol during bolus administration of intravenous
contrast. Multiplanar CT image reconstructions and MIPs were
obtained to evaluate the vascular anatomy.
CONTRAST:  75mL F2HHSM-HY6 IOPAMIDOL (F2HHSM-HY6) INJECTION 76%

[Series 6: head angio · axial · 0.44mm/px · z∈[-114,-21]mm · 4 of 53 slices shown]
[im 11/53  brain]
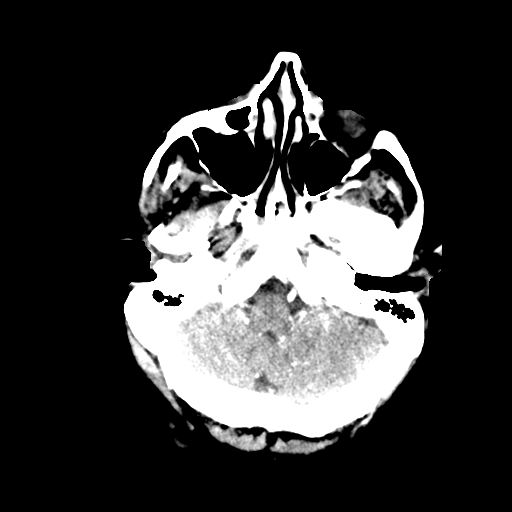
[im 21/53  bone]
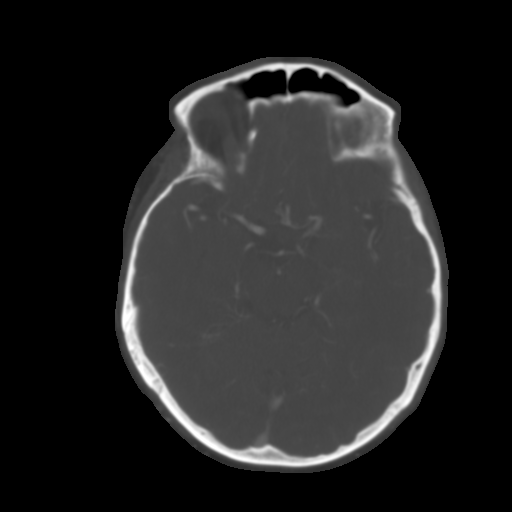
[im 32/53  brain]
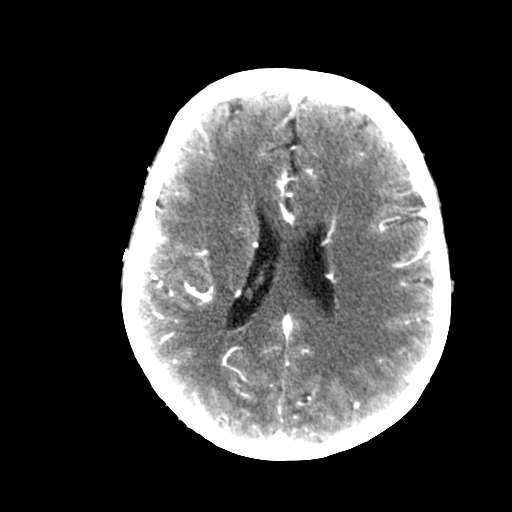
[im 42/53  bone]
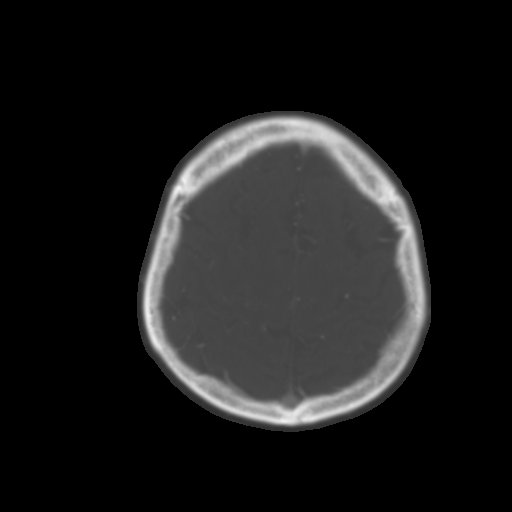

[4 of 30 positions shown; findings below may reference images not displayed]

FINDINGS: CT HEAD

Brain: No evidence of acute infarction, hemorrhage, hydrocephalus,
extra-axial collection or mass lesion/mass effect.

Vascular: No hyperdense vessel or unexpected calcification.

Skull: Normal. Negative for fracture or focal lesion.

Sinuses: Imaged portions are clear.

Orbits: No acute finding.

CTA HEAD

Anterior circulation: No significant stenosis, proximal occlusion,
aneurysm, or vascular malformation.

Posterior circulation: No significant stenosis, proximal occlusion,
aneurysm, or vascular malformation.

Venous sinuses: As permitted by contrast timing, patent.

Anatomic variants: None.
IMPRESSION: 1. No acute intracranial abnormality.
2. Normal CTA of the head.

## 2022-05-28 DIAGNOSIS — I10 Essential (primary) hypertension: Secondary | ICD-10-CM | POA: Diagnosis not present

## 2022-05-28 DIAGNOSIS — E1169 Type 2 diabetes mellitus with other specified complication: Secondary | ICD-10-CM | POA: Diagnosis not present

## 2022-06-02 DIAGNOSIS — M25561 Pain in right knee: Secondary | ICD-10-CM | POA: Diagnosis not present

## 2022-06-02 DIAGNOSIS — I10 Essential (primary) hypertension: Secondary | ICD-10-CM | POA: Diagnosis not present

## 2022-06-02 DIAGNOSIS — E785 Hyperlipidemia, unspecified: Secondary | ICD-10-CM | POA: Diagnosis not present

## 2022-06-02 DIAGNOSIS — Z6841 Body Mass Index (BMI) 40.0 and over, adult: Secondary | ICD-10-CM | POA: Diagnosis not present

## 2022-06-02 DIAGNOSIS — E1169 Type 2 diabetes mellitus with other specified complication: Secondary | ICD-10-CM | POA: Diagnosis not present

## 2022-07-13 DIAGNOSIS — M25561 Pain in right knee: Secondary | ICD-10-CM | POA: Diagnosis not present

## 2022-07-13 DIAGNOSIS — M170 Bilateral primary osteoarthritis of knee: Secondary | ICD-10-CM | POA: Diagnosis not present

## 2022-09-02 DIAGNOSIS — I10 Essential (primary) hypertension: Secondary | ICD-10-CM | POA: Diagnosis not present

## 2022-09-02 DIAGNOSIS — E1169 Type 2 diabetes mellitus with other specified complication: Secondary | ICD-10-CM | POA: Diagnosis not present

## 2022-09-16 DIAGNOSIS — B029 Zoster without complications: Secondary | ICD-10-CM | POA: Diagnosis not present

## 2022-09-16 DIAGNOSIS — Z6841 Body Mass Index (BMI) 40.0 and over, adult: Secondary | ICD-10-CM | POA: Diagnosis not present

## 2022-09-22 DIAGNOSIS — Z139 Encounter for screening, unspecified: Secondary | ICD-10-CM | POA: Diagnosis not present

## 2022-09-22 DIAGNOSIS — E785 Hyperlipidemia, unspecified: Secondary | ICD-10-CM | POA: Diagnosis not present

## 2022-09-22 DIAGNOSIS — N529 Male erectile dysfunction, unspecified: Secondary | ICD-10-CM | POA: Diagnosis not present

## 2022-09-22 DIAGNOSIS — Z6841 Body Mass Index (BMI) 40.0 and over, adult: Secondary | ICD-10-CM | POA: Diagnosis not present

## 2022-09-22 DIAGNOSIS — I10 Essential (primary) hypertension: Secondary | ICD-10-CM | POA: Diagnosis not present

## 2022-09-22 DIAGNOSIS — E1169 Type 2 diabetes mellitus with other specified complication: Secondary | ICD-10-CM | POA: Diagnosis not present

## 2022-10-07 ENCOUNTER — Encounter: Payer: Self-pay | Admitting: Gastroenterology

## 2022-10-17 DIAGNOSIS — S61317A Laceration without foreign body of left little finger with damage to nail, initial encounter: Secondary | ICD-10-CM | POA: Diagnosis not present

## 2022-10-28 ENCOUNTER — Encounter: Payer: Self-pay | Admitting: Gastroenterology

## 2022-10-28 ENCOUNTER — Ambulatory Visit (AMBULATORY_SURGERY_CENTER): Payer: Medicare HMO

## 2022-10-28 VITALS — Ht 61.0 in | Wt 250.0 lb

## 2022-10-28 DIAGNOSIS — Z1211 Encounter for screening for malignant neoplasm of colon: Secondary | ICD-10-CM

## 2022-10-28 MED ORDER — NA SULFATE-K SULFATE-MG SULF 17.5-3.13-1.6 GM/177ML PO SOLN: 1.0000 | Freq: Once | ORAL | 0 refills | Status: AC

## 2022-10-28 NOTE — Progress Notes (Signed)
No egg or soy allergy known to patient  No issues known to pt with past sedation with any surgeries or procedures Patient denies ever being told they had issues or difficulty with intubation  No FH of Malignant Hyperthermia Pt is not on diet pills Pt is not on  home 02  Pt is not on blood thinners  Pt denies issues with constipation  No A fib or A flutter Have any cardiac testing pending-- no annual phyiscal exam on 8/27 LOA: independent  Prep: suprep   Patient's chart reviewed by Cathlyn Parsons CNRA prior to previsit and patient appropriate for the [facility].  Previsit completed and red dot placed by patient's name on their procedure day (on provider's schedule).     PV competed with patient using spanish language line. Prep instructions sent via mychart and home address. All questions answered patient understand to call the office if he needs clarification with his prep. Goodrx coupon for [employer] provided to use for price reduction if needed.

## 2022-11-05 DIAGNOSIS — M1711 Unilateral primary osteoarthritis, right knee: Secondary | ICD-10-CM | POA: Diagnosis not present

## 2022-11-12 ENCOUNTER — Ambulatory Visit: Payer: Medicare HMO | Admitting: Gastroenterology

## 2022-11-12 ENCOUNTER — Encounter: Payer: Self-pay | Admitting: Gastroenterology

## 2022-11-12 VITALS — BP 142/52 | HR 73 | Temp 97.8°F | Resp 15 | Ht 61.0 in | Wt 250.0 lb

## 2022-11-12 DIAGNOSIS — K635 Polyp of colon: Secondary | ICD-10-CM | POA: Diagnosis not present

## 2022-11-12 DIAGNOSIS — D120 Benign neoplasm of cecum: Secondary | ICD-10-CM

## 2022-11-12 DIAGNOSIS — K621 Rectal polyp: Secondary | ICD-10-CM

## 2022-11-12 DIAGNOSIS — Z1211 Encounter for screening for malignant neoplasm of colon: Secondary | ICD-10-CM | POA: Diagnosis not present

## 2022-11-12 DIAGNOSIS — D128 Benign neoplasm of rectum: Secondary | ICD-10-CM

## 2022-11-12 DIAGNOSIS — I10 Essential (primary) hypertension: Secondary | ICD-10-CM | POA: Diagnosis not present

## 2022-11-12 MED ORDER — SODIUM CHLORIDE 0.9 % IV SOLN: 500.0000 mL | Freq: Once | INTRAVENOUS | Status: DC

## 2022-11-12 NOTE — Op Note (Signed)
[facility] Patient Name: Austin Davenport Procedure Date: 11/12/2022 1:23 PM MRN: [id number] Endoscopist: Viviann Spare P. Adela Lank , MD, [id number] Age: 67 Referring MD:  Date of Birth: 25-Apr-1955 Gender: Male Account #: [id number] Procedure:                Colonoscopy Indications:              Screening for colorectal malignant neoplasm, This                            is the patient's first colonoscopy Medicines:                Monitored Anesthesia Care Procedure:                Pre-Anesthesia Assessment:                           - Prior to the procedure, a History and Physical                            was performed, and patient medications and                            allergies were reviewed. The patient's tolerance of                            previous anesthesia was also reviewed. The risks                            and benefits of the procedure and the sedation                            options and risks were discussed with the patient.                            All questions were answered, and informed consent                            was obtained. Prior Anticoagulants: The patient has                            taken no anticoagulant or antiplatelet agents. ASA                            Grade Assessment: III - A patient with severe                            systemic disease. After reviewing the risks and                            benefits, the patient was deemed in satisfactory                            condition to undergo the procedure.  After obtaining informed consent, the colonoscope                            was passed under direct vision. Throughout the                            procedure, the patient's blood pressure, pulse, and                            oxygen saturations were monitored continuously. The                            Olympus CF-HQ190L [id number]) Colonoscope was                            introduced through  the anus and advanced to the the                            cecum, identified by appendiceal orifice and                            ileocecal valve. The colonoscopy was performed                            without difficulty. The patient tolerated the                            procedure well. The quality of the bowel                            preparation was adequate. The ileocecal valve,                            appendiceal orifice, and rectum were photographed. Scope In: 1:36:53 PM Scope Out: 1:52:21 PM Scope Withdrawal Time: 0 hours 12 minutes 28 seconds  Total Procedure Duration: 0 hours 15 minutes 28 seconds  Findings:                 The perianal and digital rectal examinations were                            normal.                           A 4 mm polyp was found in the cecum. The polyp was                            sessile. The polyp was removed with a cold snare.                            Resection and retrieval were complete.                           A few small-mouthed diverticula were found in the  sigmoid colon.                           Two sessile polyps were found in the rectum. The                            polyps were 3 to 4 mm in size, one with slightly                            whitish / yellow hue. These polyps were removed                            with a cold snare. Resection and retrieval were                            complete.                           Internal hemorrhoids were found during retroflexion.                           The exam was otherwise without abnormality. Complications:            No immediate complications. Estimated blood loss:                            Minimal. Estimated Blood Loss:     Estimated blood loss was minimal. Impression:               - One 4 mm polyp in the cecum, removed with a cold                            snare. Resected and retrieved.                           - Diverticulosis in the  sigmoid colon.                           - Two 3 to 4 mm polyps in the rectum, removed with                            a cold snare. Resected and retrieved.                           - Internal hemorrhoids.                           - The examination was otherwise normal. Recommendation:           - Patient has a contact number available for                            emergencies. The signs and symptoms of potential                            delayed complications  were discussed with the                            patient. Return to normal activities tomorrow.                            Written discharge instructions were provided to the                            patient.                           - Resume previous diet.                           - Continue present medications.                           - Await pathology results. Viviann Spare P. Adela Lank, MD 11/12/2022 1:56:54 PM This report has been signed electronically.

## 2022-11-12 NOTE — Progress Notes (Signed)
Called to room to assist during endoscopic procedure.  Patient ID and intended procedure confirmed with present staff. Received instructions for my participation in the procedure from the performing physician.  

## 2022-11-12 NOTE — Patient Instructions (Addendum)
Handouts on hemorrhoids, polyps, and diverticulosis given to patient. Await pathology results. Resume previous diet and continue present medications. Repeat colonoscopy for surveillance will be determined based off of pathology results.   YOU HAD AN ENDOSCOPIC PROCEDURE TODAY AT [facility]:   Refer to the procedure report that was given to you for any specific questions about what was found during the examination.  If the procedure report does not answer your questions, please call your gastroenterologist to clarify.  If you requested that your care partner not be given the details of your procedure findings, then the procedure report has been included in a sealed envelope for you to review at your convenience later.  YOU SHOULD EXPECT: Some feelings of bloating in the abdomen. Passage of more gas than usual.  Walking can help get rid of the air that was put into your GI tract during the procedure and reduce the bloating. If you had a lower endoscopy (such as a colonoscopy or flexible sigmoidoscopy) you may notice spotting of blood in your stool or on the toilet paper. If you underwent a bowel prep for your procedure, you may not have a normal bowel movement for a few days.  Please Note:  You might notice some irritation and congestion in your nose or some drainage.  This is from the oxygen used during your procedure.  There is no need for concern and it should clear up in a day or so.  SYMPTOMS TO REPORT IMMEDIATELY:  Following lower endoscopy (colonoscopy or flexible sigmoidoscopy):  Excessive amounts of blood in the stool  Significant tenderness or worsening of abdominal pains  Swelling of the abdomen that is new, acute  Fever of 100F or higher  For urgent or emergent issues, a gastroenterologist can be reached at any hour by calling [phone]. Do not use MyChart messaging for urgent concerns.    DIET:  We do recommend a small meal at first, but then you may proceed  to your regular diet.  Drink plenty of fluids but you should avoid alcoholic beverages for 24 hours.  ACTIVITY:  You should plan to take it easy for the rest of today and you should NOT DRIVE or use heavy machinery until tomorrow (because of the sedation medicines used during the test).    FOLLOW UP: Our staff will call the number listed on your records the next business day following your procedure.  We will call around 7:15- 8:00 am to check on you and address any questions or concerns that you may have regarding the information given to you following your procedure. If we do not reach you, we will leave a message.     If any biopsies were taken you will be contacted by phone or by letter within the next 1-3 weeks.  Please call us at [phone] if you have not heard about the biopsies in 3 weeks.    SIGNATURES/CONFIDENTIALITY: You and/or your care partner have signed paperwork which will be entered into your electronic medical record.  These signatures attest to the fact that that the information above on your After Visit Summary has been reviewed and is understood.  Full responsibility of the confidentiality of this discharge information lies with you and/or your care-partner.

## 2022-11-12 NOTE — Progress Notes (Signed)
[facility] History and Physical   Primary Care Physician:  Abner Greenspan, MD   Reason for Procedure:   Colon cancer screening  Plan:    colonoscopy     HPI: Austin Davenport is a 67 y.o. male  here for colonoscopy screening - first time exam.   . Patient denies any bowel symptoms at this time. No family history of colon cancer known. Otherwise feels well without any cardiopulmonary symptoms. Denies complaints today.  I have discussed risks / benefits of anesthesia and endoscopic procedure with Lynda Rainwater and they wish to proceed with the exams as outlined today.    Past Medical History:  Diagnosis Date   Abnormal electrocardiogram 06/08/2019   Depressed left ventricular ejection fraction 07/12/2019   Diarrhea    Dizziness    Erectile dysfunction    Essential hypertension 06/08/2019   LBBB (left bundle branch block) 06/08/2019   Left bundle branch block    Mixed hyperlipidemia    Morbid obesity (HCC) 06/08/2019   Pain in right foot    Pain in right knee    Pre-operative cardiovascular examination 07/12/2019   Stroke [facility])    Testicular hypofunction    Unilateral primary osteoarthritis, right knee    Vitamin D deficiency     Past Surgical History:  Procedure Laterality Date   CARPAL TUNNEL RELEASE  2009   KIDNEY STONE SURGERY  2019   LEFT HEART CATH AND CORONARY ANGIOGRAPHY N/A 12/20/2019   Procedure: LEFT HEART CATH AND CORONARY ANGIOGRAPHY;  Surgeon: Lyn Records, MD;  Location: [facility] INVASIVE CV LAB;  Service: Cardiovascular;  Laterality: N/A;   skin biopsy  2009    Prior to Admission medications   Medication Sig Start Date End Date Taking? Authorizing Provider  aspirin EC 81 MG tablet Take 81 mg by mouth daily.   Yes [provider]  atorvastatin (LIPITOR) 80 MG tablet Take 80 mg by mouth daily. 10/20/22  Yes [provider]  Azelastine-Fluticasone 137-50 MCG/ACT SUSP Place 1 spray into both nostrils daily. 11/07/19  Yes [provider]  fluticasone (FLONASE) 50 MCG/ACT nasal spray Place into both nostrils. 10/20/22  Yes [provider]  furosemide (LASIX) 40 MG tablet TAKE 1 TABLET BY MOUTH TWICE A DAY 04/15/20  Yes Tobb, Kardie, DO  hydrochlorothiazide (MICROZIDE) 12.5 MG capsule TAKE 1 CAPSULE BY MOUTH EVERY DAY 11/24/19  Yes Tobb, Kardie, DO  KLOR-CON M20 20 MEQ tablet TAKE 1 TABLET BY MOUTH TWICE A DAY 03/18/20  Yes Tobb, Kardie, DO  lisinopril (ZESTRIL) 10 MG tablet Take 10 mg by mouth daily. 07/28/22  Yes [provider]  meloxicam (MOBIC) 15 MG tablet Take 15 mg by mouth daily. 11/12/19  Yes [provider]  metoprolol tartrate (LOPRESSOR) 25 MG tablet Take 25 mg by mouth 2 (two) times daily. 11/16/19  Yes [provider]  traMADol (ULTRAM) 50 MG tablet Take 50 mg by mouth every 6 (six) hours as needed for moderate pain. 11/24/19  Yes [provider]  albuterol (VENTOLIN HFA) 108 (90 Base) MCG/ACT inhaler Inhale 1 puff into the lungs every 6 (six) hours as needed for wheezing.  11/08/19   [provider]  diclofenac Sodium (VOLTAREN) 1 % GEL Apply 1 application topically 2 (two) times daily as needed (pain).  Patient not taking: Reported on 11/26/2021    [provider]  isosorbide mononitrate (IMDUR) 30 MG 24 hr tablet Take 0.5 tablets (15 mg total) by mouth daily. 01/02/20 10/28/22  Thomasene Ripple, DO  meclizine (ANTIVERT) 25 MG tablet Take 50 mg by mouth daily.     [provider]  MOUNJARO 5 MG/0.5ML Pen Inject 5 mg into the skin once a week. 10/15/22   [provider]  valACYclovir (VALTREX) 1000 MG tablet Take 1,000 mg by mouth 3 (three) times daily. Patient not taking: Reported on 10/28/2022 09/16/22   [provider]  Vitamin D, Ergocalciferol, (DRISDOL) 1.25 MG (50000 UNIT) CAPS capsule Take 50,000 Units by mouth 2 (two) times a week. Mondays and Fridays Patient not taking: Reported on 11/26/2021    [provider]     Current Outpatient Medications  Medication Sig Dispense Refill   aspirin EC 81 MG tablet Take 81 mg by mouth daily.     atorvastatin (LIPITOR) 80 MG tablet Take 80 mg by mouth daily.     Azelastine-Fluticasone 137-50 MCG/ACT SUSP Place 1 spray into both nostrils daily.     fluticasone (FLONASE) 50 MCG/ACT nasal spray Place into both nostrils.     furosemide (LASIX) 40 MG tablet TAKE 1 TABLET BY MOUTH TWICE A DAY 60 tablet 2   hydrochlorothiazide (MICROZIDE) 12.5 MG capsule TAKE 1 CAPSULE BY MOUTH EVERY DAY 90 capsule 3   KLOR-CON M20 20 MEQ tablet TAKE 1 TABLET BY MOUTH TWICE A DAY 60 tablet 6   lisinopril (ZESTRIL) 10 MG tablet Take 10 mg by mouth daily.     meloxicam (MOBIC) 15 MG tablet Take 15 mg by mouth daily.     metoprolol tartrate (LOPRESSOR) 25 MG tablet Take 25 mg by mouth 2 (two) times daily.     traMADol (ULTRAM) 50 MG tablet Take 50 mg by mouth every 6 (six) hours as needed for moderate pain.     albuterol (VENTOLIN HFA) 108 (90 Base) MCG/ACT inhaler Inhale 1 puff into the lungs every 6 (six) hours as needed for wheezing.      diclofenac Sodium (VOLTAREN) 1 % GEL Apply 1 application topically 2 (two) times daily as needed (pain).  (Patient not taking: Reported on 11/26/2021)     isosorbide mononitrate (IMDUR) 30 MG 24 hr tablet Take 0.5 tablets (15 mg total) by mouth daily. 45 tablet 1   meclizine (ANTIVERT) 25 MG tablet Take 50 mg by mouth daily.      MOUNJARO 5 MG/0.5ML Pen Inject 5 mg into the skin once a week.     valACYclovir (VALTREX) 1000 MG tablet Take 1,000 mg by mouth 3 (three) times daily. (Patient not taking: Reported on 10/28/2022)     Vitamin D, Ergocalciferol, (DRISDOL) 1.25 MG (50000 UNIT) CAPS capsule Take 50,000 Units by mouth 2 (two) times a week. Mondays and Fridays (Patient not taking: Reported on 11/26/2021)     Current Facility-Administered Medications  Medication Dose Route Frequency Provider Last Rate Last Admin   0.9 %  sodium chloride infusion  500 mL  Intravenous Once Rafeal Skibicki, Willaim Rayas, MD        Allergies as of 11/12/2022 - Review Complete 11/12/2022  Allergen Reaction Noted   Penicillins  07/12/2019    Family History  Problem Relation Age of Onset   Heart attack Mother    Alcohol abuse Father    Colon cancer Neg Hx    Colon polyps Neg Hx    Esophageal cancer Neg Hx    Rectal cancer Neg Hx    Stomach cancer Neg Hx     Social History   Socioeconomic History   Marital status: Married    Spouse name: Not on  file   Number of children: Not on file   Years of education: Not on file   Highest education level: Not on file  Occupational History   Not on file  Tobacco Use   Smoking status: Former    Current packs/day: 0.00    Types: Cigarettes    Quit date: 2021    Years since quitting: 3.6   Smokeless tobacco: Former  Building services engineer status: Never Used  Substance and Sexual Activity   Alcohol use: Never   Drug use: Never   Sexual activity: Not on file  Other Topics Concern   Not on file  Social History Narrative   Right handed   Social Determinants of Health   Financial Resource Strain: Not on file  Food Insecurity: Not on file  Transportation Needs: Not on file  Physical Activity: Not on file  Stress: Not on file  Social Connections: Not on file  Intimate Partner Violence: Not on file    Review of Systems: All other review of systems negative except as mentioned in the HPI.  Physical Exam: Vital signs BP (!) 160/86   Pulse 90   Temp 97.8 F (36.6 C) (Temporal)   Resp 13   Ht 5\' 1"  (1.549 m)   Wt 250 lb (113.4 kg)   SpO2 99%   BMI 47.24 kg/m   General:   Alert,  Well-developed, pleasant and cooperative in NAD Lungs:  Clear throughout to auscultation.   Heart:  Regular rate and rhythm Abdomen:  Soft, nontender, protuberant, and nondistended.   Neuro/Psych:  Alert and cooperative. Normal mood and affect. A and O x 3  Harlin Rain, MD [facility]

## 2022-11-12 NOTE — Progress Notes (Signed)
Uneventful anesthetic. Report to pacu rn. Vss. Care resumed by rn. 

## 2022-11-12 NOTE — Progress Notes (Signed)
Vitals-CW  Pt's states no medical or surgical changes since previsit or office visit.  Patient did not hold his mounjaro for a week.  CRNA notified.  CRNA Sheppard Coil said that the patient could be done today.

## 2022-11-17 DIAGNOSIS — Z0000 Encounter for general adult medical examination without abnormal findings: Secondary | ICD-10-CM | POA: Diagnosis not present

## 2022-11-17 DIAGNOSIS — E785 Hyperlipidemia, unspecified: Secondary | ICD-10-CM | POA: Diagnosis not present

## 2022-11-17 DIAGNOSIS — Z1331 Encounter for screening for depression: Secondary | ICD-10-CM | POA: Diagnosis not present

## 2022-11-17 DIAGNOSIS — Z23 Encounter for immunization: Secondary | ICD-10-CM | POA: Diagnosis not present

## 2022-11-17 DIAGNOSIS — M5459 Other low back pain: Secondary | ICD-10-CM | POA: Diagnosis not present

## 2022-11-17 DIAGNOSIS — Z139 Encounter for screening, unspecified: Secondary | ICD-10-CM | POA: Diagnosis not present

## 2022-11-17 DIAGNOSIS — G8929 Other chronic pain: Secondary | ICD-10-CM | POA: Diagnosis not present

## 2022-11-17 DIAGNOSIS — Z1389 Encounter for screening for other disorder: Secondary | ICD-10-CM | POA: Diagnosis not present

## 2022-11-17 DIAGNOSIS — E1169 Type 2 diabetes mellitus with other specified complication: Secondary | ICD-10-CM | POA: Diagnosis not present

## 2022-11-17 DIAGNOSIS — Z125 Encounter for screening for malignant neoplasm of prostate: Secondary | ICD-10-CM | POA: Diagnosis not present

## 2022-11-18 ENCOUNTER — Telehealth: Payer: Self-pay | Admitting: Gastroenterology

## 2022-11-18 NOTE — Telephone Encounter (Signed)
No record of any call placed from this office. Pathology results still pending at this time.

## 2022-11-18 NOTE — Telephone Encounter (Signed)
Inbound call from patient's wife stating she received a phone call regarding patient. Patient unsure who call was from. Please advise, thank you.

## 2022-11-24 ENCOUNTER — Encounter: Payer: Self-pay | Admitting: Gastroenterology

## 2022-11-26 DIAGNOSIS — M5136 Other intervertebral disc degeneration, lumbar region: Secondary | ICD-10-CM | POA: Diagnosis not present

## 2022-11-26 DIAGNOSIS — M545 Low back pain, unspecified: Secondary | ICD-10-CM | POA: Diagnosis not present

## 2022-11-26 DIAGNOSIS — M47816 Spondylosis without myelopathy or radiculopathy, lumbar region: Secondary | ICD-10-CM | POA: Diagnosis not present

## 2022-12-08 DIAGNOSIS — Z0100 Encounter for examination of eyes and vision without abnormal findings: Secondary | ICD-10-CM | POA: Diagnosis not present

## 2022-12-15 DIAGNOSIS — H35361 Drusen (degenerative) of macula, right eye: Secondary | ICD-10-CM | POA: Diagnosis not present

## 2022-12-15 DIAGNOSIS — Z961 Presence of intraocular lens: Secondary | ICD-10-CM | POA: Diagnosis not present

## 2022-12-23 DIAGNOSIS — I10 Essential (primary) hypertension: Secondary | ICD-10-CM | POA: Diagnosis not present

## 2022-12-23 DIAGNOSIS — Z125 Encounter for screening for malignant neoplasm of prostate: Secondary | ICD-10-CM | POA: Diagnosis not present

## 2022-12-23 DIAGNOSIS — E1169 Type 2 diabetes mellitus with other specified complication: Secondary | ICD-10-CM | POA: Diagnosis not present

## 2022-12-30 DIAGNOSIS — E785 Hyperlipidemia, unspecified: Secondary | ICD-10-CM | POA: Diagnosis not present

## 2022-12-30 DIAGNOSIS — I10 Essential (primary) hypertension: Secondary | ICD-10-CM | POA: Diagnosis not present

## 2022-12-30 DIAGNOSIS — E1169 Type 2 diabetes mellitus with other specified complication: Secondary | ICD-10-CM | POA: Diagnosis not present

## 2022-12-30 DIAGNOSIS — J0100 Acute maxillary sinusitis, unspecified: Secondary | ICD-10-CM | POA: Diagnosis not present

## 2022-12-30 DIAGNOSIS — Z789 Other specified health status: Secondary | ICD-10-CM | POA: Diagnosis not present

## 2022-12-30 DIAGNOSIS — Z23 Encounter for immunization: Secondary | ICD-10-CM | POA: Diagnosis not present

## 2022-12-30 DIAGNOSIS — Z6841 Body Mass Index (BMI) 40.0 and over, adult: Secondary | ICD-10-CM | POA: Diagnosis not present

## 2023-01-21 DIAGNOSIS — I10 Essential (primary) hypertension: Secondary | ICD-10-CM | POA: Diagnosis not present

## 2023-01-22 DIAGNOSIS — E785 Hyperlipidemia, unspecified: Secondary | ICD-10-CM | POA: Diagnosis not present

## 2023-01-22 DIAGNOSIS — I10 Essential (primary) hypertension: Secondary | ICD-10-CM | POA: Diagnosis not present

## 2023-02-11 DIAGNOSIS — M25561 Pain in right knee: Secondary | ICD-10-CM | POA: Diagnosis not present

## 2023-02-21 DIAGNOSIS — E785 Hyperlipidemia, unspecified: Secondary | ICD-10-CM | POA: Diagnosis not present

## 2023-02-21 DIAGNOSIS — I10 Essential (primary) hypertension: Secondary | ICD-10-CM | POA: Diagnosis not present

## 2024-03-02 ENCOUNTER — Telehealth (HOSPITAL_BASED_OUTPATIENT_CLINIC_OR_DEPARTMENT_OTHER): Payer: Self-pay | Admitting: *Deleted

## 2024-03-02 DIAGNOSIS — I639 Cerebral infarction, unspecified: Secondary | ICD-10-CM | POA: Insufficient documentation

## 2024-03-02 NOTE — Telephone Encounter (Signed)
° °  Name: Bird Swetz  DOB: 12-08-1955  MRN: [id number]  Primary Cardiologist: Kardie Tobb, DO  Chart reviewed as part of pre-operative protocol coverage. Because of Deland Seifert's past medical history and time since last visit, he will require a follow-up in-office visit in order to better assess preoperative cardiovascular risk.  Needs new patient visit with Dr. Sheena  Pre-op covering staff: - Please schedule appointment and call patient to inform them. If patient already had an upcoming appointment within acceptable timeframe, please add pre-op clearance to the appointment notes so provider is aware. - Please contact requesting surgeon's office via preferred method (i.e, phone, fax) to inform them of need for appointment prior to surgery.   Lum LITTIE Louis, NP  03/02/2024, 10:53 AM

## 2024-03-02 NOTE — Telephone Encounter (Signed)
° °  Pre-operative Risk Assessment    Patient Name: Austin Davenport  DOB: [birth year] MRN: [id number]   Date of last office visit: 02/06/2020 Date of next office visit: None  Request for Surgical Clearance    Procedure:  Right Total Knee Arthroplasty  Date of Surgery:  Clearance 06/08/24                                 Surgeon:  Dr. Donnice Car Surgeon's Group or Practice Name:  [facility] Phone number:  [phone] Fax number:  [phone]   Type of Clearance Requested:   - Medical  - Pharmacy:  Hold Aspirin  Not indicated   Type of Anesthesia:  Spinal   Additional requests/questions:    Signed, Edsel Grayce Sanders   03/02/2024, 10:20 AM

## 2024-03-02 NOTE — Telephone Encounter (Signed)
 Pt has appt on 12/12. Preop clearance added to notes

## 2024-03-03 ENCOUNTER — Encounter: Payer: Self-pay | Admitting: Cardiology

## 2024-03-03 ENCOUNTER — Ambulatory Visit: Attending: Cardiology | Admitting: Cardiology

## 2024-03-03 VITALS — BP 146/78 | HR 73 | Ht 61.0 in | Wt 211.2 lb

## 2024-03-03 DIAGNOSIS — I447 Left bundle-branch block, unspecified: Secondary | ICD-10-CM | POA: Diagnosis not present

## 2024-03-03 DIAGNOSIS — R0609 Other forms of dyspnea: Secondary | ICD-10-CM | POA: Diagnosis not present

## 2024-03-03 DIAGNOSIS — Z01810 Encounter for preprocedural cardiovascular examination: Secondary | ICD-10-CM

## 2024-03-03 DIAGNOSIS — I2510 Atherosclerotic heart disease of native coronary artery without angina pectoris: Secondary | ICD-10-CM

## 2024-03-03 DIAGNOSIS — I10 Essential (primary) hypertension: Secondary | ICD-10-CM

## 2024-03-03 DIAGNOSIS — R011 Cardiac murmur, unspecified: Secondary | ICD-10-CM

## 2024-03-03 DIAGNOSIS — E782 Mixed hyperlipidemia: Secondary | ICD-10-CM | POA: Diagnosis not present

## 2024-03-03 HISTORY — DX: Cardiac murmur, unspecified: R01.1

## 2024-03-03 MED ORDER — NITROGLYCERIN 0.4 MG SL SUBL: 0.4000 mg | SUBLINGUAL_TABLET | SUBLINGUAL | 6 refills | Status: AC | PRN

## 2024-03-03 MED ORDER — ASPIRIN 81 MG PO TBEC: 81.0000 mg | DELAYED_RELEASE_TABLET | Freq: Every day | ORAL | Status: AC

## 2024-03-03 NOTE — Patient Instructions (Addendum)
 Medication Instructions:  Your physician recommends that you make the following changes to your medications: Start taking 81 Aspirin  daily Take Nitroglycerin  as needed Please refer to the Current Medication list given to you today.  *If you need a refill on your cardiac medications before your next appointment, please call your pharmacy*   Lab Work: None ordered If you have labs (blood work) drawn today and your tests are completely normal, you will receive your results only by: MyChart Message (if you have MyChart) OR A paper copy in the mail If you have any lab test that is abnormal or we need to change your treatment, we will call you to review the results.  Testing/Procedures: Your physician has requested that you have an echocardiogram. Echocardiography is a painless test that uses sound waves to create images of your heart. It provides your doctor with information about the size and shape of your heart and how well your hearts chambers and valves are working. This procedure takes approximately one hour. There are no restrictions for this procedure. Please do NOT wear cologne, perfume, aftershave, or lotions (deodorant is allowed). Please arrive 15 minutes prior to your appointment time.  Please note: We ask at that you not bring children with you during ultrasound (echo/ vascular) testing. Due to room size and safety concerns, children are not allowed in the ultrasound rooms during exams. Our front office staff cannot provide observation of children in our lobby area while testing is being conducted. An adult accompanying a patient to their appointment will only be allowed in the ultrasound room at the discretion of the ultrasound technician under special circumstances. We apologize for any inconvenience.    Your cardiac CT will be scheduled at one of the below locations:   [facility] [address] [place], [place] [phone]  Please follow these instructions  carefully (unless otherwise directed):  An IV will be required for this test and Nitroglycerin  will be given.  Hold all erectile dysfunction medications at least 3 days (72 hrs) prior to test. (Ie viagra, cialis, sildenafil, tadalafil, etc)   On the Night Before the Test: Be sure to Drink plenty of water. Do not consume any caffeinated/decaffeinated beverages or chocolate 12 hours prior to your test. Do not take any antihistamines 12 hours prior to your test.  On the Day of the Test: Drink plenty of water until 1 hour prior to the test. Do not eat any food 1 hour prior to test. You may take your regular medications prior to the test.  Take metoprolol (Lopressor) two hours prior to test. If you take Hydrochlorothiazide , please HOLD on the morning of the test.      After the Test: Drink plenty of water. After receiving IV contrast, you may experience a mild flushed feeling. This is normal. On occasion, you may experience a mild rash up to 24 hours after the test. This is not dangerous. If this occurs, you can take Benadryl 25 mg, Zyrtec, Claritin, or Allegra and increase your fluid intake. (Patients taking Tikosyn should avoid Benadryl, and may take Zyrtec, Claritin, or Allegra) If you experience trouble breathing, this can be serious. If it is severe call 911 IMMEDIATELY. If it is mild, please call our office.  We will call to schedule your test 2-4 weeks out understanding that some insurance companies will need an authorization prior to the service being performed.   For more information and frequently asked questions, please visit our website : [email or web address]  For non-scheduling related  questions, please contact the cardiac imaging nurse navigator should you have any questions/concerns: Cardiac Imaging Nurse Navigators Direct Office Dial: [phone]   For scheduling needs, including cancellations and rescheduling, please call Brittany,  [phone].   Follow-Up: At [facility], you and your health needs are our priority.  As part of our continuing mission to provide you with exceptional heart care, we have created designated Provider Care Teams.  These Care Teams include your primary Cardiologist (physician) and Advanced Practice Providers (APPs -  Physician Assistants and Nurse Practitioners) who all work together to provide you with the care you need, when you need it.  We recommend signing up for the patient portal called MyChart.  Sign up information is provided on this After Visit Summary.  MyChart is used to connect with patients for Virtual Visits (Telemedicine).  Patients are able to view lab/test results, encounter notes, upcoming appointments, etc.  Non-urgent messages can be sent to your provider as well.   To learn more about what you can do with MyChart, go to [email or web address].    Your next appointment:   6 month(s)  The format for your next appointment:   In Person  Provider:   Jennifer Crape, MD   Other Instructions Echocardiogram An echocardiogram is a test that uses sound waves (ultrasound) to produce images of the heart. Images from an echocardiogram can provide important information about: Heart size and shape. The size and thickness and movement of your heart's walls. Heart muscle function and strength. Heart valve function or if you have stenosis. Stenosis is when the heart valves are too narrow. If blood is flowing backward through the heart valves (regurgitation). A tumor or infectious growth around the heart valves. Areas of heart muscle that are not working well because of poor blood flow or injury from a heart attack. Aneurysm detection. An aneurysm is a weak or damaged part of an artery wall. The wall bulges out from the normal force of blood pumping through the body. Tell a health care provider about: Any allergies you have. All medicines you are taking, including vitamins,  herbs, eye drops, creams, and over-the-counter medicines. Any blood disorders you have. Any surgeries you have had. Any medical conditions you have. Whether you are pregnant or may be pregnant. What are the risks? Generally, this is a safe test. However, problems may occur, including an allergic reaction to dye (contrast) that may be used during the test. What happens before the test? No specific preparation is needed. You may eat and drink normally. What happens during the test? You will take off your clothes from the waist up and put on a hospital gown. Electrodes or electrocardiogram (ECG)patches may be placed on your chest. The electrodes or patches are then connected to a device that monitors your heart rate and rhythm. You will lie down on a table for an ultrasound exam. A gel will be applied to your chest to help sound waves pass through your skin. A handheld device, called a transducer, will be pressed against your chest and moved over your heart. The transducer produces sound waves that travel to your heart and bounce back (or echo back) to the transducer. These sound waves will be captured in real-time and changed into images of your heart that can be viewed on a video monitor. The images will be recorded on a computer and reviewed by your health care provider. You may be asked to change positions or hold your breath for a short time. This makes it  easier to get different views or better views of your heart. In some cases, you may receive contrast through an IV in one of your veins. This can improve the quality of the pictures from your heart. The procedure may vary among health care providers and hospitals.   What can I expect after the test? You may return to your normal, everyday life, including diet, activities, and medicines, unless your health care provider tells you not to do that. Follow these instructions at home: It is up to you to get the results of your test. Ask your  health care provider, or the department that is doing the test, when your results will be ready. Keep all follow-up visits. This is important. Summary An echocardiogram is a test that uses sound waves (ultrasound) to produce images of the heart. Images from an echocardiogram can provide important information about the size and shape of your heart, heart muscle function, heart valve function, and other possible heart problems. You do not need to do anything to prepare before this test. You may eat and drink normally. After the echocardiogram is completed, you may return to your normal, everyday life, unless your health care provider tells you not to do that. This information is not intended to replace advice given to you by your health care provider. Make sure you discuss any questions you have with your health care provider. Document Revised: 10/31/2019 Document Reviewed: 10/31/2019 Elsevier Patient Education  2021 Elsevier Inc.   Important Information About Sugar       h

## 2024-03-03 NOTE — Progress Notes (Signed)
 Cardiology Office Note:    Date:  03/03/2024   ID:  Austin Davenport, DOB 05/06/1955, MRN [id number]  PCP:  Austin Hun, MD  Cardiologist:  Austin JONELLE Crape, MD   Referring MD: Austin Hun, MD    ASSESSMENT:    1. Mixed hyperlipidemia   2. Coronary artery disease involving native coronary artery of native heart without angina pectoris   3. Essential hypertension   4. LBBB (left bundle branch block)   5. Morbid obesity (HCC)   6. Pre-operative cardiovascular examination   7. Cardiac murmur    PLAN:    In order of problems listed above:  Coronary artery disease: Preoperative cardiovascular risk assessment: Patient leads a sedentary lifestyle because of orthopedic issues.  He has significant coronary artery disease and I reviewed his previous report.  In view of this I recommended CT coronary angiography with FFR and he is agreeable.  Further recommendations will depend on the findings of this test.  He was advised to take a coated baby aspirin  on a regular basis.  Sublingual nitroglycerin  prescription was sent, its protocol and 911 protocol explained and the patient vocalized understanding questions were answered to the patient's satisfaction Essential hypertension: Blood pressure stable and diet was emphasized.  Lifestyle modification urged.  His blood pressures at home are fine and he tells me that pain increases his blood pressure. Mixed lipidemia: On lipid-lowering medications followed by primary care.  Goal LDL should be less than 60. Morbid obesity: Weight reduction stressed diet emphasized and he promises to do better.  Risks of obesity explained. Cardiac murmur: Echocardiogram will be done to assess murmur heard on auscultation. Patient will be seen in follow-up appointment in 6 months or earlier if the patient has any concerns.    Medication Adjustments/Labs and Tests Ordered: Current medicines are reviewed at length with the patient today.  Concerns regarding medicines are  outlined above.  Orders Placed This Encounter  Procedures   EKG 12-Lead   No orders of the defined types were placed in this encounter.    History of Present Illness:    Austin Davenport is a 68 y.o. male who is being seen today for the evaluation of preoperative cardiovascular assessment for knee surgery in a patient with known coronary artery disease at the request of Austin Hun, MD. patient is a pleasant 68 year old male.  He has past medical history of essential hypertension, mixed dyslipidemia, diabetes mellitus, morbid obesity and left bundle branch block.  He is planning to undergo knee surgery.  He denies any chest pain orthopnea or PND he ambulates with a cane.  At the time of my evaluation, the patient is alert awake oriented and in no distress.  He is here for preop risk assessment.  Past Medical History:  Diagnosis Date   Abnormal electrocardiogram 06/08/2019   Abnormal nuclear cardiac imaging test    Coronary artery disease involving native coronary artery of native heart without angina pectoris 02/06/2020   Depressed left ventricular ejection fraction 07/12/2019   Diarrhea    Dizziness    Erectile dysfunction    Essential hypertension 06/08/2019   LBBB (left bundle branch block) 06/08/2019   Left bundle branch block    Mixed hyperlipidemia    Morbid obesity (HCC) 06/08/2019   Pain in right foot    Pain in right knee    Pre-operative cardiovascular examination 07/12/2019   Shortness of breath    Stroke [facility])    Testicular hypofunction    Unilateral primary osteoarthritis, right knee  Vitamin D deficiency     Past Surgical History:  Procedure Laterality Date   CARPAL TUNNEL RELEASE  2009   KIDNEY STONE SURGERY  2019   LEFT HEART CATH AND CORONARY ANGIOGRAPHY N/A 12/20/2019   Procedure: LEFT HEART CATH AND CORONARY ANGIOGRAPHY;  Surgeon: Austin Victory ORN, MD;  Location: [facility] INVASIVE CV LAB;  Service: Cardiovascular;  Laterality: N/A;   skin biopsy  2009    Current  Medications: Active Medications[1]   Allergies:   Penicillins   Social History   Socioeconomic History   Marital status: Married    Spouse name: Not on file   Number of children: Not on file   Years of education: Not on file   Highest education level: Not on file  Occupational History   Not on file  Tobacco Use   Smoking status: Former    Current packs/day: 0.00    Types: Cigarettes    Quit date: 2021    Years since quitting: 4.9   Smokeless tobacco: Former  Building Services Engineer status: Never Used  Substance and Sexual Activity   Alcohol  use: Never   Drug use: Never   Sexual activity: Not on file  Other Topics Concern   Not on file  Social History Narrative   Right handed   Social Drivers of Health   Tobacco Use: Medium Risk (03/03/2024)   Patient History    Smoking Tobacco Use: Former    Smokeless Tobacco Use: Former    Passive Exposure: Not on Stage Manager: Not on Ship Broker Insecurity: Not on file  Transportation Needs: Not on file  Physical Activity: Not on file  Stress: Not on file  Social Connections: Not on file  Depression (PHQ2-9): Not on file  Alcohol  Screen: Not on file  Housing: Not on file  Utilities: Not on file  Health Literacy: Not on file     Family History: The patient's family history includes Alcohol  abuse in his father; Heart attack in his mother. There is no history of Colon cancer, Colon polyps, Esophageal cancer, Rectal cancer, or Stomach cancer.  ROS:   Please see the history of present illness.    All other systems reviewed and are negative.  EKGs/Labs/Other Studies Reviewed:    The following studies were reviewed today:  EKG Interpretation Date/Time:  Friday March 03 2024 09:16:18 EST Ventricular Rate:  73 PR Interval:  182 QRS Duration:  142 QT Interval:  412 QTC Calculation: 453 R Axis:   -53  Text Interpretation: Normal sinus rhythm Left axis deviation Left bundle branch block Abnormal ECG When  compared with ECG of 20-Dec-2019 08:06, No significant change was found Confirmed by Austin Davenport [phone]) on 03/03/2024 9:55:15 AM     Recent Labs: No results found for requested labs within last 365 days.  Recent Lipid Panel No results found for: CHOL, TRIG, HDL, CHOLHDL, VLDL, LDLCALC, LDLDIRECT  Physical Exam:    VS:  BP (!) 146/78   Pulse 73   Ht 5' 1 (1.549 m)   Wt 211 lb 4 oz (95.8 kg)   SpO2 96%   BMI 39.92 kg/m     Wt Readings from Last 3 Encounters:  03/03/24 211 lb 4 oz (95.8 kg)  11/12/22 250 lb (113.4 kg)  10/28/22 250 lb (113.4 kg)     GEN: Patient is in no acute distress HEENT: Normal NECK: No JVD; No carotid bruits LYMPHATICS: No lymphadenopathy CARDIAC: S1 S2 regular, 2/6 systolic murmur at  the apex. RESPIRATORY:  Clear to auscultation without rales, wheezing or rhonchi  ABDOMEN: Soft, non-tender, non-distended MUSCULOSKELETAL:  No edema; No deformity  SKIN: Warm and dry NEUROLOGIC:  Alert and oriented x 3 PSYCHIATRIC:  Normal affect    Signed, Austin JONELLE Crape, MD  03/03/2024 10:02 AM    [facility]      [1]  Current Meds  Medication Sig   albuterol (VENTOLIN HFA) 108 (90 Base) MCG/ACT inhaler Inhale 1 puff into the lungs every 6 (six) hours as needed for wheezing.    atorvastatin (LIPITOR) 80 MG tablet Take 80 mg by mouth daily.   diclofenac Sodium (VOLTAREN) 1 % GEL Apply 1 Application topically as needed.   fluticasone (FLONASE) 50 MCG/ACT nasal spray Place 1 spray into both nostrils as needed for allergies or rhinitis.   furosemide  (LASIX ) 40 MG tablet TAKE 1 TABLET BY MOUTH TWICE A DAY   gabapentin (NEURONTIN) 300 MG capsule Take 300 mg by mouth daily.   hydrochlorothiazide  (MICROZIDE ) 12.5 MG capsule TAKE 1 CAPSULE BY MOUTH EVERY DAY   lisinopril (ZESTRIL) 20 MG tablet Take 20 mg by mouth daily.   meclizine (ANTIVERT) 25 MG tablet Take 50 mg by mouth daily.    meloxicam (MOBIC) 15 MG tablet Take 15  mg by mouth daily.   metoprolol tartrate (LOPRESSOR) 25 MG tablet Take 25 mg by mouth 2 (two) times daily.   MOUNJARO 7.5 MG/0.5ML Pen Inject 7.5 mg into the skin once a week.   traMADol (ULTRAM) 50 MG tablet Take 50 mg by mouth every 6 (six) hours as needed for moderate pain.   [DISCONTINUED] aspirin  EC 81 MG tablet Take 81 mg by mouth daily.   [DISCONTINUED] Azelastine-Fluticasone 137-50 MCG/ACT SUSP Place 1 spray into both nostrils daily.

## 2024-03-17 ENCOUNTER — Encounter (HOSPITAL_COMMUNITY): Payer: Self-pay

## 2024-03-17 ENCOUNTER — Telehealth (HOSPITAL_COMMUNITY): Payer: Self-pay | Admitting: Emergency Medicine

## 2024-03-17 NOTE — Telephone Encounter (Signed)
 error

## 2024-03-20 ENCOUNTER — Telehealth (HOSPITAL_COMMUNITY): Payer: Self-pay | Admitting: *Deleted

## 2024-03-20 NOTE — Telephone Encounter (Signed)
 Reaching out to patient to offer assistance regarding upcoming cardiac imaging study; pt verbalizes understanding of appt date/time, parking situation and where to check in, pre-test NPO status and medications ordered, and verified current allergies; name and call back number provided for further questions should they arise Sid Seats RN Navigator Cardiac Imaging [facility] [phone] office [phone] cell

## 2024-03-21 ENCOUNTER — Other Ambulatory Visit: Payer: Self-pay | Admitting: Cardiology

## 2024-03-21 ENCOUNTER — Ambulatory Visit (HOSPITAL_BASED_OUTPATIENT_CLINIC_OR_DEPARTMENT_OTHER)
Admission: RE | Admit: 2024-03-21 | Discharge: 2024-03-21 | Disposition: A | Discharge status: Home / Self Care | Source: Ambulatory Visit | Attending: Cardiology | Admitting: Cardiology

## 2024-03-21 DIAGNOSIS — R931 Abnormal findings on diagnostic imaging of heart and coronary circulation: Secondary | ICD-10-CM

## 2024-03-21 DIAGNOSIS — I10 Essential (primary) hypertension: Secondary | ICD-10-CM

## 2024-03-21 DIAGNOSIS — I700 Atherosclerosis of aorta: Secondary | ICD-10-CM | POA: Diagnosis not present

## 2024-03-21 DIAGNOSIS — R0609 Other forms of dyspnea: Secondary | ICD-10-CM | POA: Diagnosis not present

## 2024-03-21 MED ORDER — NITROGLYCERIN 0.4 MG SL SUBL
0.8000 mg | SUBLINGUAL_TABLET | Freq: Once | SUBLINGUAL | Status: AC
  Administered 2024-03-21: 0.8 mg via SUBLINGUAL

## 2024-03-21 MED ORDER — IOHEXOL 350 MG/ML SOLN
95.0000 mL | Freq: Once | INTRAVENOUS | Status: AC | PRN
  Administered 2024-03-21: 95 mL via INTRAVENOUS

## 2024-03-21 NOTE — Progress Notes (Signed)
Ffr Order

## 2024-03-27 ENCOUNTER — Ambulatory Visit: Payer: Self-pay | Admitting: Cardiology

## 2024-03-30 ENCOUNTER — Ambulatory Visit: Attending: Cardiology

## 2024-03-30 ENCOUNTER — Encounter: Payer: Self-pay | Admitting: Cardiology

## 2024-03-30 ENCOUNTER — Ambulatory Visit (INDEPENDENT_AMBULATORY_CARE_PROVIDER_SITE_OTHER): Admitting: Cardiology

## 2024-03-30 VITALS — BP 138/80 | HR 73 | Ht 61.0 in | Wt 216.8 lb

## 2024-03-30 DIAGNOSIS — R011 Cardiac murmur, unspecified: Secondary | ICD-10-CM | POA: Diagnosis not present

## 2024-03-30 DIAGNOSIS — I2510 Atherosclerotic heart disease of native coronary artery without angina pectoris: Secondary | ICD-10-CM | POA: Diagnosis not present

## 2024-03-30 DIAGNOSIS — I10 Essential (primary) hypertension: Secondary | ICD-10-CM | POA: Diagnosis not present

## 2024-03-30 DIAGNOSIS — I447 Left bundle-branch block, unspecified: Secondary | ICD-10-CM

## 2024-03-30 DIAGNOSIS — E782 Mixed hyperlipidemia: Secondary | ICD-10-CM | POA: Diagnosis not present

## 2024-03-30 NOTE — H&P (View-Only) (Signed)
 " Cardiology Office Note:    Date:  03/30/2024 not letting me sign this note for some reason  ID:  Demontray Franta, DOB 31-Jul-1955, MRN [id number]  PCP:  Trinidad Hun, MD  Cardiologist:  Jennifer JONELLE Crape, MD   Referring MD: Trinidad Hun, MD    ASSESSMENT:    1. Mixed hyperlipidemia   2. Coronary artery disease involving native coronary artery of native heart without angina pectoris   3. Essential hypertension   4. LBBB (left bundle branch block)   5. Morbid obesity (HCC)    PLAN:    In order of problems listed above:  Coronary artery disease: Abnormal CT coronary angiography: Preoperative cardiovascular evaluation: I discussed findings with the patient at length.  Patient is taking a coated baby aspirin  on a daily basis.  Sublingual nitroglycerin  prescription was sent, its protocol and 911 protocol explained and the patient vocalized understanding questions were answered to the patient's satisfaction.  Following recommendations were made today.I discussed coronary angiography and left heart catheterization with the patient at extensive length. Procedure, benefits and potential risks were explained. Patient had multiple questions which were answered to the patient's satisfaction. Patient agreed and consented for the procedure. Further recommendations will be made based on the findings of the coronary angiography. In the interim. The patient has any significant symptoms he knows to go to the nearest emergency room. I told him that if he needs stenting then his surgery will have to be postponed by at least 6 weeks and he understands that. Essential hypertension: Blood pressure stable and diet was emphasized.  Lifestyle modification was urged.  His blood pressure is better at home. Mixed dyslipidemia.  With these findings we will get him back for fasting lipid lipid and will start him on statin therapy if he is agreeable.  Benefits risks explained and questions were answered to satisfaction. Morbid  obesity: Weight reduction stressed diet emphasized and he promises to do better.  Risks of obesity explained. He will be seen in follow-up appointment after coronary angiography.   Medication Adjustments/Labs and Tests Ordered: Current medicines are reviewed at length with the patient today.  Concerns regarding medicines are outlined above.  Orders Placed This Encounter  Procedures   EKG 12-Lead   No orders of the defined types were placed in this encounter.    No chief complaint on file.    History of Present Illness:    Austin Davenport is a 69 y.o. male.  Patient has past medical history of coronary artery disease.  He has history of essential hypertension dyslipidemia and morbid obesity.  He underwent preoperative evaluation and CT coronary angiography has revealed significant right coronary artery stenosis.  Patient is here for follow-up.  He leads a sedentary lifestyle.  He denies chest pain orthopnea or PND.  He does have some shortness of breath.  At the time of my evaluation, the patient is alert awake oriented and in no distress.  Past Medical History:  Diagnosis Date   Abnormal electrocardiogram 06/08/2019   Abnormal nuclear cardiac imaging test    Cardiac murmur 03/03/2024   Coronary artery disease involving native coronary artery of native heart without angina pectoris 02/06/2020   Depressed left ventricular ejection fraction 07/12/2019   Diarrhea    Dizziness    Erectile dysfunction    Essential hypertension 06/08/2019   LBBB (left bundle branch block) 06/08/2019   Left bundle branch block    Mixed hyperlipidemia    Morbid obesity (HCC) 06/08/2019   Pain in  right foot    Pain in right knee    Pre-operative cardiovascular examination 07/12/2019   Shortness of breath    Stroke [facility])    Testicular hypofunction    Unilateral primary osteoarthritis, right knee    Vitamin D deficiency     Past Surgical History:  Procedure Laterality Date   CARPAL TUNNEL RELEASE   2009   KIDNEY STONE SURGERY  2019   LEFT HEART CATH AND CORONARY ANGIOGRAPHY N/A 12/20/2019   Procedure: LEFT HEART CATH AND CORONARY ANGIOGRAPHY;  Surgeon: Claudene Victory ORN, MD;  Location: [facility];  Service: Cardiovascular;  Laterality: N/A;   skin biopsy  2009    Current Medications: Active Medications[1]   Allergies:   Penicillins   Social History   Socioeconomic History   Marital status: Married    Spouse name: Not on file   Number of children: Not on file   Years of education: Not on file   Highest education level: Not on file  Occupational History   Not on file  Tobacco Use   Smoking status: Former    Current packs/day: 0.00    Types: Cigarettes    Quit date: 2021    Years since quitting: 5.0   Smokeless tobacco: Former  Building Services Engineer status: Never Used  Substance and Sexual Activity   Alcohol  use: Never   Drug use: Never   Sexual activity: Not on file  Other Topics Concern   Not on file  Social History Narrative   Right handed   Social Drivers of Health   Tobacco Use: Medium Risk (03/30/2024)   Patient History    Smoking Tobacco Use: Former    Smokeless Tobacco Use: Former    Passive Exposure: Not on Stage Manager: Not on Ship Broker Insecurity: Not on file  Transportation Needs: Not on file  Physical Activity: Not on file  Stress: Not on file  Social Connections: Not on file  Depression (PHQ2-9): Not on file  Alcohol  Screen: Not on file  Housing: Not on file  Utilities: Not on file  Health Literacy: Not on file     Family History: The patient's family history includes Alcohol  abuse in his father; Heart attack in his mother. There is no history of Colon cancer, Colon polyps, Esophageal cancer, Rectal cancer, or Stomach cancer.  ROS:   Please see the history of present illness.    All other systems reviewed and are negative.  EKGs/Labs/Other Studies Reviewed:    The following studies were reviewed today: EKG  reveals sinus rhythm with left bundle branch block.       Recent Labs: No results found for requested labs within last 365 days.  Recent Lipid Panel No results found for: CHOL, TRIG, HDL, CHOLHDL, VLDL, LDLCALC, LDLDIRECT  Physical Exam:    VS:  BP (!) 142/86   Pulse 73   Ht 5' 1 (1.549 m)   Wt 216 lb 12.8 oz (98.3 kg)   SpO2 96%   BMI 40.96 kg/m     Wt Readings from Last 3 Encounters:  03/30/24 216 lb 12.8 oz (98.3 kg)  03/03/24 211 lb 4 oz (95.8 kg)  11/12/22 250 lb (113.4 kg)     GEN: Patient is in no acute distress HEENT: Normal NECK: No JVD; No carotid bruits LYMPHATICS: No lymphadenopathy CARDIAC: Hear sounds regular, 2/6 systolic murmur at the apex. RESPIRATORY:  Clear to auscultation without rales, wheezing or rhonchi  ABDOMEN: Soft, non-tender,  non-distended MUSCULOSKELETAL:  No edema; No deformity  SKIN: Warm and dry NEUROLOGIC:  Alert and oriented x 3 PSYCHIATRIC:  Normal affect   Signed, Jennifer JONELLE Crape, MD  03/30/2024 1:19 PM    [facility]      [1]  Current Meds  Medication Sig   albuterol (VENTOLIN HFA) 108 (90 Base) MCG/ACT inhaler Inhale 1 puff into the lungs every 6 (six) hours as needed for wheezing.    aspirin  EC 81 MG tablet Take 1 tablet (81 mg total) by mouth daily. Swallow whole.   atorvastatin  (LIPITOR) 80 MG tablet Take 80 mg by mouth daily.   diclofenac Sodium (VOLTAREN) 1 % GEL Apply 1 Application topically as needed.   fluticasone (FLONASE) 50 MCG/ACT nasal spray Place 1 spray into both nostrils as needed for allergies or rhinitis.   furosemide  (LASIX ) 40 MG tablet TAKE 1 TABLET BY MOUTH TWICE A DAY   gabapentin  (NEURONTIN ) 300 MG capsule Take 300 mg by mouth daily.   hydrochlorothiazide  (MICROZIDE ) 12.5 MG capsule TAKE 1 CAPSULE BY MOUTH EVERY DAY   lisinopril (ZESTRIL) 20 MG tablet Take 20 mg by mouth daily.   meclizine (ANTIVERT) 25 MG tablet Take 50 mg by mouth daily.    meloxicam  (MOBIC) 15 MG tablet Take 15 mg by mouth daily.   metoprolol  tartrate (LOPRESSOR ) 25 MG tablet Take 25 mg by mouth 2 (two) times daily.   MOUNJARO 7.5 MG/0.5ML Pen Inject 7.5 mg into the skin once a week.   nitroGLYCERIN  (NITROSTAT ) 0.4 MG SL tablet Place 1 tablet (0.4 mg total) under the tongue every 5 (five) minutes as needed for chest pain.   traMADol (ULTRAM) 50 MG tablet Take 50 mg by mouth every 6 (six) hours as needed for moderate pain.   "

## 2024-03-30 NOTE — Progress Notes (Signed)
 " Cardiology Office Note:    Date:  03/30/2024 not letting me sign this note for some reason  ID:  [name], DOB [birth year], MRN [id number]  PCP:  Trinidad Hun, MD  Cardiologist:  Jennifer JONELLE Crape, MD   Referring MD: Trinidad Hun, MD    ASSESSMENT:    1. Mixed hyperlipidemia   2. Coronary artery disease involving native coronary artery of native heart without angina pectoris   3. Essential hypertension   4. LBBB (left bundle branch block)   5. Morbid obesity (HCC)    PLAN:    In order of problems listed above:  Coronary artery disease: Abnormal CT coronary angiography: Preoperative cardiovascular evaluation: I discussed findings with the patient at length.  Patient is taking a coated baby aspirin  on a daily basis.  Sublingual nitroglycerin  prescription was sent, its protocol and 911 protocol explained and the patient vocalized understanding questions were answered to the patient's satisfaction.  Following recommendations were made today.I discussed coronary angiography and left heart catheterization with the patient at extensive length. Procedure, benefits and potential risks were explained. Patient had multiple questions which were answered to the patient's satisfaction. Patient agreed and consented for the procedure. Further recommendations will be made based on the findings of the coronary angiography. In the interim. The patient has any significant symptoms he knows to go to the nearest emergency room. I told him that if he needs stenting then his surgery will have to be postponed by at least 6 weeks and he understands that. Essential hypertension: Blood pressure stable and diet was emphasized.  Lifestyle modification was urged.  His blood pressure is better at home. Mixed dyslipidemia.  With these findings we will get him back for fasting lipid lipid and will start him on statin therapy if he is agreeable.  Benefits risks explained and questions were answered to satisfaction. Morbid  obesity: Weight reduction stressed diet emphasized and he promises to do better.  Risks of obesity explained. He will be seen in follow-up appointment after coronary angiography.   Medication Adjustments/Labs and Tests Ordered: Current medicines are reviewed at length with the patient today.  Concerns regarding medicines are outlined above.  Orders Placed This Encounter  Procedures   EKG 12-Lead   No orders of the defined types were placed in this encounter.    No chief complaint on file.    History of Present Illness:    Austin Davenport is a 69 y.o. male.  Patient has past medical history of coronary artery disease.  He has history of essential hypertension dyslipidemia and morbid obesity.  He underwent preoperative evaluation and CT coronary angiography has revealed significant right coronary artery stenosis.  Patient is here for follow-up.  He leads a sedentary lifestyle.  He denies chest pain orthopnea or PND.  He does have some shortness of breath.  At the time of my evaluation, the patient is alert awake oriented and in no distress.  Past Medical History:  Diagnosis Date   Abnormal electrocardiogram 06/08/2019   Abnormal nuclear cardiac imaging test    Cardiac murmur 03/03/2024   Coronary artery disease involving native coronary artery of native heart without angina pectoris 02/06/2020   Depressed left ventricular ejection fraction 07/12/2019   Diarrhea    Dizziness    Erectile dysfunction    Essential hypertension 06/08/2019   LBBB (left bundle branch block) 06/08/2019   Left bundle branch block    Mixed hyperlipidemia    Morbid obesity (HCC) 06/08/2019   Pain in  right foot    Pain in right knee    Pre-operative cardiovascular examination 07/12/2019   Shortness of breath    Stroke [facility])    Testicular hypofunction    Unilateral primary osteoarthritis, right knee    Vitamin D deficiency     Past Surgical History:  Procedure Laterality Date   CARPAL TUNNEL RELEASE   2009   KIDNEY STONE SURGERY  2019   LEFT HEART CATH AND CORONARY ANGIOGRAPHY N/A 12/20/2019   Procedure: LEFT HEART CATH AND CORONARY ANGIOGRAPHY;  Surgeon: Claudene Victory ORN, MD;  Location: [facility];  Service: Cardiovascular;  Laterality: N/A;   skin biopsy  2009    Current Medications: Active Medications[1]   Allergies:   Penicillins   Social History   Socioeconomic History   Marital status: Married    Spouse name: Not on file   Number of children: Not on file   Years of education: Not on file   Highest education level: Not on file  Occupational History   Not on file  Tobacco Use   Smoking status: Former    Current packs/day: 0.00    Types: Cigarettes    Quit date: 2021    Years since quitting: 5.0   Smokeless tobacco: Former  Building Services Engineer status: Never Used  Substance and Sexual Activity   Alcohol  use: Never   Drug use: Never   Sexual activity: Not on file  Other Topics Concern   Not on file  Social History Narrative   Right handed   Social Drivers of Health   Tobacco Use: Medium Risk (03/30/2024)   Patient History    Smoking Tobacco Use: Former    Smokeless Tobacco Use: Former    Passive Exposure: Not on Stage Manager: Not on Ship Broker Insecurity: Not on file  Transportation Needs: Not on file  Physical Activity: Not on file  Stress: Not on file  Social Connections: Not on file  Depression (PHQ2-9): Not on file  Alcohol  Screen: Not on file  Housing: Not on file  Utilities: Not on file  Health Literacy: Not on file     Family History: The patient's family history includes Alcohol  abuse in his father; Heart attack in his mother. There is no history of Colon cancer, Colon polyps, Esophageal cancer, Rectal cancer, or Stomach cancer.  ROS:   Please see the history of present illness.    All other systems reviewed and are negative.  EKGs/Labs/Other Studies Reviewed:    The following studies were reviewed today: EKG  reveals sinus rhythm with left bundle branch block.       Recent Labs: No results found for requested labs within last 365 days.  Recent Lipid Panel No results found for: CHOL, TRIG, HDL, CHOLHDL, VLDL, LDLCALC, LDLDIRECT  Physical Exam:    VS:  BP (!) 142/86   Pulse 73   Ht 5' 1 (1.549 m)   Wt 216 lb 12.8 oz (98.3 kg)   SpO2 96%   BMI 40.96 kg/m     Wt Readings from Last 3 Encounters:  03/30/24 216 lb 12.8 oz (98.3 kg)  03/03/24 211 lb 4 oz (95.8 kg)  11/12/22 250 lb (113.4 kg)     GEN: Patient is in no acute distress HEENT: Normal NECK: No JVD; No carotid bruits LYMPHATICS: No lymphadenopathy CARDIAC: Hear sounds regular, 2/6 systolic murmur at the apex. RESPIRATORY:  Clear to auscultation without rales, wheezing or rhonchi  ABDOMEN: Soft, non-tender,  non-distended MUSCULOSKELETAL:  No edema; No deformity  SKIN: Warm and dry NEUROLOGIC:  Alert and oriented x 3 PSYCHIATRIC:  Normal affect   Signed, Jennifer JONELLE Crape, MD  03/30/2024 1:19 PM    [facility]      [1]  Current Meds  Medication Sig   albuterol (VENTOLIN HFA) 108 (90 Base) MCG/ACT inhaler Inhale 1 puff into the lungs every 6 (six) hours as needed for wheezing.    aspirin  EC 81 MG tablet Take 1 tablet (81 mg total) by mouth daily. Swallow whole.   atorvastatin  (LIPITOR) 80 MG tablet Take 80 mg by mouth daily.   diclofenac Sodium (VOLTAREN) 1 % GEL Apply 1 Application topically as needed.   fluticasone (FLONASE) 50 MCG/ACT nasal spray Place 1 spray into both nostrils as needed for allergies or rhinitis.   furosemide  (LASIX ) 40 MG tablet TAKE 1 TABLET BY MOUTH TWICE A DAY   gabapentin  (NEURONTIN ) 300 MG capsule Take 300 mg by mouth daily.   hydrochlorothiazide  (MICROZIDE ) 12.5 MG capsule TAKE 1 CAPSULE BY MOUTH EVERY DAY   lisinopril (ZESTRIL) 20 MG tablet Take 20 mg by mouth daily.   meclizine (ANTIVERT) 25 MG tablet Take 50 mg by mouth daily.    meloxicam  (MOBIC) 15 MG tablet Take 15 mg by mouth daily.   metoprolol  tartrate (LOPRESSOR ) 25 MG tablet Take 25 mg by mouth 2 (two) times daily.   MOUNJARO 7.5 MG/0.5ML Pen Inject 7.5 mg into the skin once a week.   nitroGLYCERIN  (NITROSTAT ) 0.4 MG SL tablet Place 1 tablet (0.4 mg total) under the tongue every 5 (five) minutes as needed for chest pain.   traMADol (ULTRAM) 50 MG tablet Take 50 mg by mouth every 6 (six) hours as needed for moderate pain.   "

## 2024-03-30 NOTE — Patient Instructions (Signed)
 Medication Instructions:  Your physician recommends that you continue on your current medications as directed. Please refer to the Current Medication list given to you today.   *If you need a refill on your cardiac medications before your next appointment, please call your pharmacy*   Lab Work: Your physician recommends that you have a BMET and CBC today in the office for your upcoming procedure.  If you have labs (blood work) drawn today and your tests are completely normal, you will receive your results only by: MyChart Message (if you have MyChart) OR A paper copy in the mail If you have any lab test that is abnormal or we need to change your treatment, we will call you to review the results.   Testing/Procedures:  [place] [facility] [place]  [address] [place] [address] Dept: [phone] Loc: [phone]  Cheick Suhr  03/30/2024  You are scheduled for a Cardiac Catheterization on Friday, January 16 with Dr. [name].  1. Please arrive at the [facility] (Main Entrance A) at [facility]: [address] [place], [place] [address] at 5:30 AM (This time is 2 hour(s) before your procedure to ensure your preparation).   Free valet parking service is available. You will check in at ADMITTING. The support person will be asked to wait in the waiting room.  It is OK to have someone drop you off and come back when you are ready to be discharged.    Special note: Every effort is made to have your procedure done on time. Please understand that emergencies sometimes delay scheduled procedures.  2. Diet: Nothing to eat after midnight.   3. Hydration: You need to be well hydrated before your procedure. On January 16, you may drink approved liquids (see below) until 2 hours before the procedure, with 16 oz of water  as your last intake.   List of approved liquids water , clear juice, clear  tea, black coffee, fruit juices, non-citric and without pulp, carbonated beverages, Gatorade, Kool -Aid, plain Jello-O and plain ice popsicles.  4. Labs: You will need to have blood drawn on Friday, January 9 at [facility]: [address], [place] . You do need to be fasting.  5. Medication instructions in preparation for your procedure:   Contrast Allergy: No  Stop taking, Lisinopril (Zestril or Prinivil) Friday, January 16,, Mobic (Meloxicam) Friday, January 16,, Lasix  (Furosemide )  and HTCZ (Hydrochlorothiazide ) Friday, January 16,, Mounjaro Friday, January 16,  On the morning of your procedure, take your Aspirin  81 mg and any morning medicines NOT listed above.  You may use sips of water .  6. Plan to go home the same day, you will only stay overnight if medically necessary. 7. Bring a current list of your medications and current insurance cards. 8. You MUST have a responsible person to drive you home. 9. Someone MUST be with you the first 24 hours after you arrive home or your discharge will be delayed. 10. Please wear clothes that are easy to get on and off and wear slip-on shoes.  Thank you for allowing us  to care for you!   -- [facility]  Follow-Up: At [facility], you and your health needs are our priority.  As part of our continuing mission to provide you with exceptional heart care, we have created designated Provider Care Teams.  These Care Teams include your primary Cardiologist (physician) and Advanced Practice Providers (  APPs -  Physician Assistants and Nurse Practitioners) who all work together to provide you with the care you need, when you need it.  We recommend signing up for the patient portal called MyChart.  Sign up information is provided on this After Visit Summary.  MyChart is used to connect with patients for Virtual Visits (Telemedicine).  Patients are able to view lab/test results, encounter notes, upcoming appointments,  etc.  Non-urgent messages can be sent to your provider as well.   To learn more about what you can do with MyChart, go to [email or web address].    Your next appointment:   4 week(s)  The format for your next appointment:   In Person  Provider:   Jennifer Crape, MD   Other Instructions  Coronary Angiogram With Stent Coronary angiogram with stent placement is a procedure to widen or open a narrow blood vessel of the heart (coronary artery). Arteries may become blocked by cholesterol buildup (plaques) in the lining of the artery wall. When a coronary artery becomes partially blocked, blood flow to that area decreases. This may lead to chest pain or a heart attack (myocardial infarction). A stent is a small piece of metal that looks like mesh or spring. Stent placement may be done as treatment after a heart attack, or to prevent a heart attack if a blocked artery is found by a coronary angiogram. Let your health care provider know about: Any allergies you have, including allergies to medicines or contrast dye. All medicines you are taking, including vitamins, herbs, eye drops, creams, and over-the-counter medicines. Any problems you or family members have had with anesthetic medicines. Any blood disorders you have. Any surgeries you have had. Any medical conditions you have, including kidney problems or kidney failure. Whether you are pregnant or may be pregnant. Whether you are breastfeeding. What are the risks? Generally, this is a safe procedure. However, serious problems may occur, including: Damage to nearby structures or organs, such as the heart, blood vessels, or kidneys. A return of blockage. Bleeding, infection, or bruising at the insertion site. A collection of blood under the skin (hematoma) at the insertion site. A blood clot in another part of the body. Allergic reaction to medicines or dyes. Bleeding into the abdomen (retroperitoneal bleeding). Stroke (rare). Heart  attack (rare). What happens before the procedure? Staying hydrated Follow instructions from your health care provider about hydration, which may include: Up to 2 hours before the procedure - you may continue to drink clear liquids, such as water , clear fruit juice, black coffee, and plain tea.    Eating and drinking restrictions Follow instructions from your health care provider about eating and drinking, which may include: 8 hours before the procedure - stop eating heavy meals or foods, such as meat, fried foods, or fatty foods. 6 hours before the procedure - stop eating light meals or foods, such as toast or cereal. 2 hours before the procedure - stop drinking clear liquids. Medicines Ask your health care provider about: Changing or stopping your regular medicines. This is especially important if you are taking diabetes medicines or blood thinners. Taking medicines such as aspirin  and ibuprofen. These medicines can thin your blood. Do not take these medicines unless your health care provider tells you to take them. Generally, aspirin  is recommended before a thin tube, called a catheter, is passed through a blood vessel and inserted into the heart (cardiac catheterization). Taking over-the-counter medicines, vitamins, herbs, and supplements. General instructions Do not use any products that  contain nicotine or tobacco for at least 4 weeks before the procedure. These products include cigarettes, e-cigarettes, and chewing tobacco. If you need help quitting, ask your health care provider. Plan to have someone take you home from the hospital or clinic. If you will be going home right after the procedure, plan to have someone with you for 24 hours. You may have tests and imaging procedures. Ask your health care provider: How your insertion site will be marked. Ask which artery will be used for the procedure. What steps will be taken to help prevent infection. These may include: Removing hair at  the insertion site. Washing skin with a germ-killing soap. Taking antibiotic medicine. What happens during the procedure? An IV will be inserted into one of your veins. Electrodes may be placed on your chest to monitor your heart rate during the procedure. You will be given one or more of the following: A medicine to help you relax (sedative). A medicine to numb the area (local anesthetic) for catheter insertion. A small incision will be made for catheter insertion. The catheter will be inserted into an artery using a guide wire. The location may be in your groin, your wrist, or the fold of your arm (near your elbow). An X-ray procedure (fluoroscopy) will be used to help guide the catheter to the opening of the heart arteries. A dye will be injected into the catheter. X-rays will be taken. The dye helps to show where any narrowing or blockages are located in the arteries. Tell your health care provider if you have chest pain or trouble breathing. A tiny wire will be guided to the blocked spot, and a balloon will be inflated to make the artery wider. The stent will be expanded to crush the plaques into the wall of the vessel. The stent will hold the area open and improve the blood flow. Most stents have a drug coating to reduce the risk of the stent narrowing over time. The artery may be made wider using a drill, laser, or other tools that remove plaques. The catheter will be removed when the blood flow improves. The stent will stay where it was placed, and the lining of the artery will grow over it. A bandage (dressing) will be placed on the insertion site. Pressure will be applied to stop bleeding. The IV will be removed. This procedure may vary among health care providers and hospitals.    What happens after the procedure? Your blood pressure, heart rate, breathing rate, and blood oxygen level will be monitored until you leave the hospital or clinic. If the procedure is done through the  leg, you will lie flat in bed for a few hours or for as long as told by your health care provider. You will be instructed not to bend or cross your legs. The insertion site and the pulse in your foot or wrist will be checked often. You may have more blood tests, X-rays, and a test that records the electrical activity of your heart (electrocardiogram, or ECG). Do not drive for 24 hours if you were given a sedative during your procedure. Summary Coronary angiogram with stent placement is a procedure to widen or open a narrowed coronary artery. This is done to treat heart problems. Before the procedure, let your health care provider know about all the medical conditions and surgeries you have or have had. This is a safe procedure. However, some problems may occur, including damage to nearby structures or organs, bleeding, blood clots, or  allergies. Follow your health care provider's instructions about eating, drinking, medicines, and other lifestyle changes, such as quitting tobacco use before the procedure. This information is not intended to replace advice given to you by your health care provider. Make sure you discuss any questions you have with your health care provider. Document Revised: 09/28/2018 Document Reviewed: 09/28/2018 Elsevier Patient Education  2021 [employer].

## 2024-03-31 LAB — ECHOCARDIOGRAM COMPLETE
AR max vel: 2.21 cm2
AV Area VTI: 2.31 cm2
AV Area mean vel: 2.21 cm2
AV Mean grad: 3.7 mmHg
AV Peak grad: 6.4 mmHg
Ao pk vel: 1.27 m/s
Area-P 1/2: 4.33 cm2
MV M vel: 2.24 m/s
MV Peak grad: 20.1 mmHg
MV VTI: 1.18 cm2
S' Lateral: 3.3 cm

## 2024-04-01 ENCOUNTER — Ambulatory Visit: Payer: Self-pay | Admitting: Cardiology

## 2024-04-01 LAB — CBC WITH DIFFERENTIAL/PLATELET
Basophils Absolute: 0.1 x10E3/uL (ref 0.0–0.2)
Basos: 1 %
EOS (ABSOLUTE): 0.3 x10E3/uL (ref 0.0–0.4)
Eos: 3 %
Hematocrit: 45.6 % (ref 37.5–51.0)
Hemoglobin: 15.1 g/dL (ref 13.0–17.7)
Immature Grans (Abs): 0 x10E3/uL (ref 0.0–0.1)
Immature Granulocytes: 0 %
Lymphocytes Absolute: 3.6 x10E3/uL — ABNORMAL HIGH (ref 0.7–3.1)
Lymphs: 35 %
MCH: 32.1 pg (ref 26.6–33.0)
MCHC: 33.1 g/dL (ref 31.5–35.7)
MCV: 97 fL (ref 79–97)
Monocytes Absolute: 0.6 x10E3/uL (ref 0.1–0.9)
Monocytes: 6 %
Neutrophils Absolute: 5.8 x10E3/uL (ref 1.4–7.0)
Neutrophils: 55 %
Platelets: 226 x10E3/uL (ref 150–450)
RBC: 4.71 x10E6/uL (ref 4.14–5.80)
RDW: 11.9 % (ref 11.6–15.4)
WBC: 10.4 x10E3/uL (ref 3.4–10.8)

## 2024-04-01 LAB — LIPID PANEL
Chol/HDL Ratio: 2.8 ratio (ref 0.0–5.0)
Cholesterol, Total: 92 mg/dL — ABNORMAL LOW (ref 100–199)
HDL: 33 mg/dL — ABNORMAL LOW
LDL Chol Calc (NIH): 39 mg/dL (ref 0–99)
Triglycerides: 104 mg/dL (ref 0–149)
VLDL Cholesterol Cal: 20 mg/dL (ref 5–40)

## 2024-04-01 LAB — COMPREHENSIVE METABOLIC PANEL WITH GFR
ALT: 26 IU/L (ref 0–44)
AST: 22 IU/L (ref 0–40)
Albumin: 4.1 g/dL (ref 3.9–4.9)
Alkaline Phosphatase: 100 IU/L (ref 47–123)
BUN/Creatinine Ratio: 14 (ref 10–24)
BUN: 14 mg/dL (ref 8–27)
Bilirubin Total: 0.6 mg/dL (ref 0.0–1.2)
CO2: 24 mmol/L (ref 20–29)
Calcium: 9.2 mg/dL (ref 8.6–10.2)
Chloride: 104 mmol/L (ref 96–106)
Creatinine, Ser: 0.98 mg/dL (ref 0.76–1.27)
Globulin, Total: 2.6 g/dL (ref 1.5–4.5)
Glucose: 94 mg/dL (ref 70–99)
Potassium: 4.2 mmol/L (ref 3.5–5.2)
Sodium: 145 mmol/L — ABNORMAL HIGH (ref 134–144)
Total Protein: 6.7 g/dL (ref 6.0–8.5)
eGFR: 84 mL/min/1.73

## 2024-04-05 ENCOUNTER — Telehealth: Payer: Self-pay | Admitting: *Deleted

## 2024-04-05 NOTE — Telephone Encounter (Signed)
 Cardiac Catheterization scheduled at [facility] for: Friday April 07, 2024 7:30 AM Arrival time [facility] Main Entrance A at: 5:30 AM  Diet: -Nothing to eat after midnight.  Hydration: -May drink clear liquids until 2 hours before the procedure.  Approved liquids: Water , clear tea, black coffee, fruit juices-non-citric and without pulp,Gatorade, plain Jello/popsicles.   -Please drink 16 oz of water  2 hours before procedure.  Medication instructions: -Hold:  Lasix -hydrochlorothiazide -AM of procedure  -Other usual morning medications can be taken including aspirin  81 mg.  Pt tells me Mounjaro is weekly on Tuesdays.  Plan to go home the same day, you will only stay overnight if medically necessary.  You must have responsible adult to drive you home.  Someone must be with you the first 24 hours after you arrive home.  Reviewed procedure instructions with patient.

## 2024-04-07 ENCOUNTER — Other Ambulatory Visit (HOSPITAL_COMMUNITY): Payer: Self-pay

## 2024-04-07 ENCOUNTER — Other Ambulatory Visit: Payer: Self-pay

## 2024-04-07 ENCOUNTER — Ambulatory Visit (HOSPITAL_COMMUNITY)
Admission: RE | Admit: 2024-04-07 | Discharge: 2024-04-07 | Disposition: A | Discharge status: Home / Self Care | Attending: Cardiovascular Disease | Admitting: Cardiovascular Disease

## 2024-04-07 ENCOUNTER — Encounter (HOSPITAL_COMMUNITY): Admission: RE | Disposition: A | Payer: Self-pay | Source: Home / Self Care | Attending: Cardiovascular Disease

## 2024-04-07 DIAGNOSIS — Z87891 Personal history of nicotine dependence: Secondary | ICD-10-CM | POA: Insufficient documentation

## 2024-04-07 DIAGNOSIS — Z955 Presence of coronary angioplasty implant and graft: Secondary | ICD-10-CM

## 2024-04-07 DIAGNOSIS — I25118 Atherosclerotic heart disease of native coronary artery with other forms of angina pectoris: Secondary | ICD-10-CM | POA: Diagnosis not present

## 2024-04-07 DIAGNOSIS — Z6841 Body Mass Index (BMI) 40.0 and over, adult: Secondary | ICD-10-CM | POA: Diagnosis not present

## 2024-04-07 DIAGNOSIS — Z7982 Long term (current) use of aspirin: Secondary | ICD-10-CM | POA: Diagnosis not present

## 2024-04-07 DIAGNOSIS — I447 Left bundle-branch block, unspecified: Secondary | ICD-10-CM | POA: Diagnosis not present

## 2024-04-07 DIAGNOSIS — I2510 Atherosclerotic heart disease of native coronary artery without angina pectoris: Secondary | ICD-10-CM | POA: Diagnosis present

## 2024-04-07 DIAGNOSIS — I119 Hypertensive heart disease without heart failure: Secondary | ICD-10-CM | POA: Diagnosis not present

## 2024-04-07 DIAGNOSIS — E782 Mixed hyperlipidemia: Secondary | ICD-10-CM | POA: Diagnosis not present

## 2024-04-07 DIAGNOSIS — I10 Essential (primary) hypertension: Secondary | ICD-10-CM

## 2024-04-07 HISTORY — PX: LEFT HEART CATH AND CORONARY ANGIOGRAPHY: CATH118249

## 2024-04-07 HISTORY — PX: CORONARY STENT INTERVENTION: CATH118234

## 2024-04-07 LAB — POCT ACTIVATED CLOTTING TIME
Activated Clotting Time: 245 s
Activated Clotting Time: 261 s

## 2024-04-07 LAB — GLUCOSE, CAPILLARY
Glucose-Capillary: 97 mg/dL (ref 70–99)
Glucose-Capillary: 117 mg/dL — ABNORMAL HIGH (ref 70–99)

## 2024-04-07 MED ORDER — CLOPIDOGREL BISULFATE 75 MG PO TABS
75.0000 mg | ORAL_TABLET | Freq: Every day | ORAL | 2 refills | Status: AC
  Filled 2024-04-07: qty 30, 30d supply, fill #0

## 2024-04-07 MED ORDER — ATORVASTATIN CALCIUM 80 MG PO TABS: 80.0000 mg | ORAL_TABLET | Freq: Every day | ORAL | Status: DC

## 2024-04-07 MED ORDER — FREE WATER: 500.0000 mL | Freq: Once | Status: DC

## 2024-04-07 MED ORDER — GABAPENTIN 300 MG PO CAPS: 300.0000 mg | ORAL_CAPSULE | Freq: Two times a day (BID) | ORAL | Status: DC

## 2024-04-07 MED ORDER — SODIUM CHLORIDE 0.9% FLUSH: 3.0000 mL | INTRAVENOUS | Status: DC | PRN

## 2024-04-07 MED ORDER — MIDAZOLAM HCL 2 MG/2ML IJ SOLN
INTRAMUSCULAR | Status: AC
  Filled 2024-04-07: qty 2

## 2024-04-07 MED ORDER — FENTANYL CITRATE (PF) 100 MCG/2ML IJ SOLN
INTRAMUSCULAR | Status: AC
  Filled 2024-04-07: qty 2

## 2024-04-07 MED ORDER — FAMOTIDINE IN NACL 20-0.9 MG/50ML-% IV SOLN
INTRAVENOUS | Status: DC | PRN
  Administered 2024-04-07: 20 mg via INTRAVENOUS

## 2024-04-07 MED ORDER — SODIUM CHLORIDE 0.9 % IV SOLN: 250.0000 mL | INTRAVENOUS | Status: DC | PRN

## 2024-04-07 MED ORDER — FENTANYL CITRATE (PF) 100 MCG/2ML IJ SOLN
INTRAMUSCULAR | Status: DC | PRN
  Administered 2024-04-07: 50 ug via INTRAVENOUS

## 2024-04-07 MED ORDER — ACETAMINOPHEN 325 MG PO TABS: 650.0000 mg | ORAL_TABLET | ORAL | Status: DC | PRN

## 2024-04-07 MED ORDER — HEPARIN SODIUM (PORCINE) 1000 UNIT/ML IJ SOLN
INTRAMUSCULAR | Status: AC
  Filled 2024-04-07: qty 10

## 2024-04-07 MED ORDER — FUROSEMIDE 20 MG PO TABS: 40.0000 mg | ORAL_TABLET | Freq: Every day | ORAL | Status: DC

## 2024-04-07 MED ORDER — NITROGLYCERIN 0.4 MG SL SUBL: 0.4000 mg | SUBLINGUAL_TABLET | SUBLINGUAL | Status: DC | PRN

## 2024-04-07 MED ORDER — VERAPAMIL HCL 2.5 MG/ML IV SOLN
INTRAVENOUS | Status: AC
  Filled 2024-04-07: qty 2

## 2024-04-07 MED ORDER — IOHEXOL 350 MG/ML SOLN
INTRAVENOUS | Status: DC | PRN
  Administered 2024-04-07: 90 mL

## 2024-04-07 MED ORDER — HYDRALAZINE HCL 20 MG/ML IJ SOLN: 10.0000 mg | INTRAMUSCULAR | Status: DC | PRN

## 2024-04-07 MED ORDER — LABETALOL HCL 5 MG/ML IV SOLN: 10.0000 mg | INTRAVENOUS | Status: DC | PRN

## 2024-04-07 MED ORDER — CLOPIDOGREL BISULFATE 300 MG PO TABS
ORAL_TABLET | ORAL | Status: DC | PRN
  Administered 2024-04-07: 600 mg via ORAL

## 2024-04-07 MED ORDER — SODIUM CHLORIDE 0.9% FLUSH: 3.0000 mL | Freq: Two times a day (BID) | INTRAVENOUS | Status: DC

## 2024-04-07 MED ORDER — FUROSEMIDE 40 MG PO TABS
40.0000 mg | ORAL_TABLET | Freq: Every day | ORAL | 6 refills | Status: AC
  Filled 2024-04-07: qty 30, 30d supply, fill #0

## 2024-04-07 MED ORDER — NITROGLYCERIN 0.4 MG SL SUBL
0.4000 mg | SUBLINGUAL_TABLET | SUBLINGUAL | 2 refills | Status: AC | PRN
  Filled 2024-04-07: qty 25, 5d supply, fill #0

## 2024-04-07 MED ORDER — FAMOTIDINE IN NACL 20-0.9 MG/50ML-% IV SOLN
INTRAVENOUS | Status: AC
  Filled 2024-04-07: qty 50

## 2024-04-07 MED ORDER — LIDOCAINE HCL (PF) 1 % IJ SOLN
INTRAMUSCULAR | Status: AC
  Filled 2024-04-07: qty 30

## 2024-04-07 MED ORDER — LIDOCAINE HCL (PF) 1 % IJ SOLN
INTRAMUSCULAR | Status: DC | PRN
  Administered 2024-04-07: 2 mL via INTRADERMAL

## 2024-04-07 MED ORDER — VERAPAMIL HCL 2.5 MG/ML IV SOLN
INTRAVENOUS | Status: DC | PRN
  Administered 2024-04-07: 10 mL via INTRA_ARTERIAL

## 2024-04-07 MED ORDER — CLOPIDOGREL BISULFATE 75 MG PO TABS: 75.0000 mg | ORAL_TABLET | Freq: Every day | ORAL | Status: DC

## 2024-04-07 MED ORDER — ONDANSETRON HCL 4 MG/2ML IJ SOLN: 4.0000 mg | Freq: Four times a day (QID) | INTRAMUSCULAR | Status: DC | PRN

## 2024-04-07 MED ORDER — MIDAZOLAM HCL (PF) 2 MG/2ML IJ SOLN
INTRAMUSCULAR | Status: DC | PRN
  Administered 2024-04-07: 2 mg via INTRAVENOUS

## 2024-04-07 MED ORDER — SODIUM CHLORIDE 0.9 % IV SOLN
INTRAVENOUS | Status: DC | PRN
  Administered 2024-04-07: 10 mL/h via INTRAVENOUS

## 2024-04-07 MED ORDER — HEPARIN SODIUM (PORCINE) 1000 UNIT/ML IJ SOLN
INTRAMUSCULAR | Status: DC | PRN
  Administered 2024-04-07: 3000 [IU] via INTRAVENOUS
  Administered 2024-04-07: 5000 [IU] via INTRAVENOUS
  Administered 2024-04-07: 6000 [IU] via INTRAVENOUS

## 2024-04-07 MED ORDER — CLOPIDOGREL BISULFATE 300 MG PO TABS
ORAL_TABLET | ORAL | Status: AC
  Filled 2024-04-07: qty 2

## 2024-04-07 MED ORDER — ASPIRIN 81 MG PO CHEW: 81.0000 mg | CHEWABLE_TABLET | ORAL | Status: DC

## 2024-04-07 MED ORDER — HEPARIN (PORCINE) IN NACL 1000-0.9 UT/500ML-% IV SOLN
INTRAVENOUS | Status: DC | PRN
  Administered 2024-04-07 (×2): 500 mL

## 2024-04-07 MED ORDER — METOPROLOL TARTRATE 12.5 MG HALF TABLET: 25.0000 mg | ORAL_TABLET | Freq: Two times a day (BID) | ORAL | Status: DC

## 2024-04-07 MED ORDER — ASPIRIN 81 MG PO TBEC: 81.0000 mg | DELAYED_RELEASE_TABLET | Freq: Every day | ORAL | Status: DC

## 2024-04-07 NOTE — Progress Notes (Signed)
 CARDIAC REHAB PHASE I     Post stent education including site care, restrictions, risk factors, exercise guidelines, NTG use, antiplatelet therapy importance, heart healthy diet, and CRP2 reviewed. All questions and concerns addressed. Will refer to [facility] for CRP2. Plan for home later today.    [id number] Austin [name], RN BSN 04/07/2024 10:49 AM

## 2024-04-07 NOTE — Interval H&P Note (Signed)
 History and Physical Interval Note:  04/07/2024 7:16 AM  Austin Davenport  has presented today for surgery, with the diagnosis of abnormal ct.  The various methods of treatment have been discussed with the patient and family. After consideration of risks, benefits and other options for treatment, the patient has consented to  Procedures: LEFT HEART CATH AND CORONARY ANGIOGRAPHY (N/A) as a surgical intervention.  The patient's history has been reviewed, patient examined, no change in status, stable for surgery.  I have reviewed the patient's chart and labs.  Questions were answered to the patient's satisfaction.    Cath Lab Visit (complete for each Cath Lab visit)  Clinical Evaluation Leading to the Procedure:   ACS: No.  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

## 2024-04-07 NOTE — Progress Notes (Signed)
 TRB removed - no active bleeding noted, not hematoma. 2x2 gauze with transparent tape applied, also medipore tape put over dressing. Patient instructed to remove in 24 hours.

## 2024-04-07 NOTE — Progress Notes (Signed)
 Patient up to BR for void, steady gait. Positive for void. Tolerated regular diet without emesis. His support person will be here at noon to review discharge instructions and transport patient home at 1300 (4 hour bed rest).

## 2024-04-07 NOTE — Discharge Instructions (Signed)

## 2024-04-07 NOTE — Progress Notes (Signed)
 Discharge instructions reviewed with patient and his adult son. Questions asked and answered, copy given. Pharmacy to the bedside to give discharge medications. APP orders received to discharge patient.EKG performed while in PACU. Patient ready for discharge to home.

## 2024-04-07 NOTE — Discharge Summary (Signed)
 "   Discharge Summary for Same Day PCI   Patient ID: Austin Davenport MRN: [id number]; DOB: September 17, 1955  Admit date: 04/07/2024 Discharge date: 04/07/2024  Primary Care Provider: Trinidad Hun, MD  Primary Cardiologist: Jennifer JONELLE Crape, MD  Primary Electrophysiologist:  None   Discharge Diagnoses    Active Problems:   CAD (coronary artery disease)    Diagnostic Studies/Procedures    Cardiac Catheterization 04/07/2024:   Prox LAD to Mid LAD lesion is 65% stenosed.   2nd Mrg lesion is 40% stenosed.   Prox RCA lesion is 90% stenosed.   RPDA lesion is 50% stenosed.   A drug-eluting stent was successfully placed using a STENT SYNERGY XD 3.0X12.   Post intervention, there is a 0% residual stenosis.   Moderate diffuse disease through the proximal and mid LAD, unchanged from last cath in 2021 Moderate disease in the obtuse marginal branch, unchanged from last cath in 2021 Large dominant RCA with severe proximal stenosis. Moderate disease in the PDA Successful PTCA/DES x 1 proximal RCA Normal LV pressures   Recommendations: He will need DAPT with ASA and Plavix  for at least 3 months prior to interruption for elective knee surgery. Same day post PCI discharge on ASA/Plavix /statin/beta blocker.   _____________   History of Present Illness     Austin Davenport is a 69 y.o. male with hypertension, left bundle branch block, CAD, hyperlipidemia.   Patient recently seen outpatient with abnormal coronary CTA 02/2024 demonstrating high likelihood of hemodynamically significant stenosis in the distal LCx.  Echocardiogram had shown preserved biventricular function without any significant valve disease.  Had no chest pain.  Reported mild shortness of breath PTA.  Cardiac catheterization was arranged for further evaluation.  Hospital Course     The patient underwent cardiac cath as noted above with moderate disease in the proximal/mid LAD and obtuse marginal disease unchanged from 2021.  There was  large dominant RCA with severe proximal stenosis and moderate disease in the PDA.  Underwent PTCA/DES times one of the proximal RCA.  Plan for DAPT with ASA/Plavix  for at least 3 months.  DAPT therapy should not be stopped during this timeframe for any elective procedures.    The patient was seen by cardiac rehab while in short stay. There were no observed complications post cath. Radial cath site was re-evaluated prior to discharge and found to be stable without any complications. Instructions/precautions regarding cath site care were given prior to discharge.  Austin Davenport was seen by Dr. Verlin and determined stable for discharge home. Follow up with our office has been arranged. Medications are listed below. Pertinent changes include .  We did stop the following medications because patient reported he was not taking. Albuterol, fluticasone, lisinopril 20 mg, meclizine, meloxicam. Not entirely sure what he is taking at home, instructed patient to bring all medications he is taking up to upcoming appointment.   _____________  Cath/PCI Registry Performance & Quality Measures: Aspirin  prescribed? - Yes ADP Receptor Inhibitor (Plavix /Clopidogrel , Brilinta/Ticagrelor or Effient/Prasugrel) prescribed (includes medically managed patients)? - Yes High Intensity Statin (Lipitor 40-80mg  or Crestor 20-40mg ) prescribed? - Yes For EF <40%, was ACEI/ARB prescribed? - Not Applicable (EF >/= 40%) For EF <40%, Aldosterone Antagonist (Spironolactone or Eplerenone) prescribed? - Not Applicable (EF >/= 40%) Cardiac Rehab Phase II ordered (Included Medically managed Patients)? - Yes  _____________   Discharge Vitals Blood pressure 137/79, pulse 83, temperature 97.6 F (36.4 C), temperature source Oral, resp. rate 17, height 5' 1 (1.549 m), weight 97.5 kg,  SpO2 94%.  Filed Weights   04/07/24 0604  Weight: 97.5 kg    Last Labs & Radiologic Studies    CBC No results for input(s): WBC,  NEUTROABS, HGB, HCT, MCV, PLT in the last 72 hours. Basic Metabolic Panel No results for input(s): NA, K, CL, CO2, GLUCOSE, BUN, CREATININE, CALCIUM , MG, PHOS in the last 72 hours. Liver Function Tests No results for input(s): AST, ALT, ALKPHOS, BILITOT, PROT, ALBUMIN in the last 72 hours. No results for input(s): LIPASE, AMYLASE in the last 72 hours. High Sensitivity Troponin:   No results for input(s): TROPONINIHS in the last 720 hours.  BNP Invalid input(s): POCBNP D-Dimer No results for input(s): DDIMER in the last 72 hours. Hemoglobin A1C No results for input(s): HGBA1C in the last 72 hours. Fasting Lipid Panel No results for input(s): CHOL, HDL, LDLCALC, TRIG, CHOLHDL, LDLDIRECT in the last 72 hours. Thyroid Function Tests No results for input(s): TSH, T4TOTAL, T3FREE, THYROIDAB in the last 72 hours.  Invalid input(s): FREET3 _____________  CARDIAC CATHETERIZATION Result Date: 04/07/2024   Prox LAD to Mid LAD lesion is 65% stenosed.   2nd Mrg lesion is 40% stenosed.   Prox RCA lesion is 90% stenosed.   RPDA lesion is 50% stenosed.   A drug-eluting stent was successfully placed using a STENT SYNERGY XD 3.0X12.   Post intervention, there is a 0% residual stenosis. Moderate diffuse disease through the proximal and mid LAD, unchanged from last cath in 2021 Moderate disease in the obtuse marginal branch, unchanged from last cath in 2021 Large dominant RCA with severe proximal stenosis. Moderate disease in the PDA Successful PTCA/DES x 1 proximal RCA Normal LV pressures Recommendations: He will need DAPT with ASA and Plavix  for at least 3 months prior to interruption for elective knee surgery. Same day post PCI discharge on ASA/Plavix /statin/beta blocker.   ECHOCARDIOGRAM COMPLETE Result Date: 03/31/2024    ECHOCARDIOGRAM REPORT   Patient Name:   Austin Davenport Date of Exam: 03/30/2024 Medical Rec #:  [id number]    Height:        61.0 in Accession #:    [id number]   Weight:       211.2 lb Date of Birth:  02-13-1956    BSA:          1.933 m Patient Age:    68 years     BP:           146/78 mmHg Patient Gender: M            HR:           68 bpm. Exam Location:  [facility] Procedure: 2D Echo, Cardiac Doppler, Color Doppler and Strain Analysis (Both            Spectral and Color Flow Doppler were utilized during procedure). Indications:    Cardiac murmur [R01.1 (ICD-10-CM)]  History:        Patient has prior history of Echocardiogram examinations, most                 recent 11/16/2019. CAD, Abnormal ECG, Stroke, Arrythmias:LBBB,                 Signs/Symptoms:Murmur, Shortness of Breath and                 Dizziness/Lightheadedness; Risk Factors:Hypertension,                 Dyslipidemia and Former Smoker. Prior echo- 50% EF, trace MR,  trace TR, mild aortic sclerosis.  Sonographer:    Saddie Chimes Referring Phys: CYRUS JENNIFER SAUNDERS [facility] IMPRESSIONS  1. Left ventricular ejection fraction, by estimation, is 50 to 55%. The left ventricle has low normal function. The left ventricle has no regional wall motion abnormalities. Left ventricular diastolic parameters are consistent with Grade I diastolic dysfunction (impaired relaxation). The average left ventricular global longitudinal strain is 16.7 %. The global longitudinal strain is abnormal.  2. Right ventricular systolic function is normal. The right ventricular size is normal. There is normal pulmonary artery systolic pressure.  3. The mitral valve is normal in structure. No evidence of mitral valve regurgitation. No evidence of mitral stenosis.  4. The aortic valve is normal in structure. Aortic valve regurgitation is not visualized. No aortic stenosis is present.  5. The inferior vena cava is normal in size with greater than 50% respiratory variability, suggesting right atrial pressure of 3 mmHg. FINDINGS  Left Ventricle: Left ventricular ejection fraction, by estimation, is  50 to 55%. The left ventricle has low normal function. The left ventricle has no regional wall motion abnormalities. The average left ventricular global longitudinal strain is 16.7 %.  Strain was performed and the global longitudinal strain is abnormal. The left ventricular internal cavity size was normal in size. There is borderline left ventricular hypertrophy. Abnormal (paradoxical) septal motion, consistent with left bundle branch  block. Left ventricular diastolic parameters are consistent with Grade I diastolic dysfunction (impaired relaxation). Right Ventricle: The right ventricular size is normal. No increase in right ventricular wall thickness. Right ventricular systolic function is normal. There is normal pulmonary artery systolic pressure. The tricuspid regurgitant velocity is 2.54 m/s, and  with an assumed right atrial pressure of 3 mmHg, the estimated right ventricular systolic pressure is 28.8 mmHg. Left Atrium: Left atrial size was normal in size. Right Atrium: Right atrial size was normal in size. Pericardium: There is no evidence of pericardial effusion. Mitral Valve: The mitral valve is normal in structure. No evidence of mitral valve regurgitation. No evidence of mitral valve stenosis. MV peak gradient, 6.5 mmHg. The mean mitral valve gradient is 2.0 mmHg. Tricuspid Valve: The tricuspid valve is normal in structure. Tricuspid valve regurgitation is trivial. No evidence of tricuspid stenosis. Aortic Valve: The aortic valve is normal in structure. Aortic valve regurgitation is not visualized. No aortic stenosis is present. Aortic valve mean gradient measures 3.7 mmHg. Aortic valve peak gradient measures 6.4 mmHg. Aortic valve area, by VTI measures 2.31 cm. Pulmonic Valve: The pulmonic valve was normal in structure. Pulmonic valve regurgitation is not visualized. No evidence of pulmonic stenosis. Aorta: The aortic root is normal in size and structure. Venous: The inferior vena cava is normal in size  with greater than 50% respiratory variability, suggesting right atrial pressure of 3 mmHg. IAS/Shunts: No atrial level shunt detected by color flow Doppler.  LEFT VENTRICLE PLAX 2D LVIDd:         4.50 cm   Diastology LVIDs:         3.30 cm   LV e' medial:    7.07 cm/s LV PW:         1.10 cm   LV E/e' medial:  12.7 LV IVS:        1.10 cm   LV e' lateral:   8.70 cm/s LVOT diam:     2.00 cm   LV E/e' lateral: 10.4 LV SV:         64 LV SV Index:  33        2D Longitudinal Strain LVOT Area:     3.14 cm  2D Strain GLS Avg:     16.7 %  RIGHT VENTRICLE RV Basal diam:  3.20 cm RV Mid diam:    2.50 cm TAPSE (M-mode): 2.4 cm LEFT ATRIUM           Index        RIGHT ATRIUM           Index LA diam:      3.80 cm 1.97 cm/m   RA Area:     13.80 cm 7.14 cm/m LA Vol (A4C): 25.7 ml 13.29 ml/m  AORTIC VALVE                    PULMONIC VALVE AV Area (Vmax):    2.21 cm     PV Vmax:       1.19 m/s AV Area (Vmean):   2.21 cm     PV Vmean:      82.100 cm/s AV Area (VTI):     2.31 cm     PV VTI:        0.288 m AV Vmax:           126.67 cm/s  PV Peak grad:  5.7 mmHg AV Vmean:          84.167 cm/s  PV Mean grad:  3.0 mmHg AV VTI:            0.276 m AV Peak Grad:      6.4 mmHg AV Mean Grad:      3.7 mmHg LVOT Vmax:         89.15 cm/s LVOT Vmean:        59.300 cm/s LVOT VTI:          0.202 m LVOT/AV VTI ratio: 0.73  AORTA Ao Root diam: 2.90 cm Ao Asc diam:  3.40 cm MITRAL VALVE                TRICUSPID VALVE MV Area (PHT): 4.33 cm     TR Peak grad:   25.8 mmHg MV Area VTI:   1.18 cm     TR Vmax:        254.00 cm/s MV Peak grad:  6.5 mmHg MV Mean grad:  2.0 mmHg     SHUNTS MV Vmax:       1.27 m/s     Systemic VTI:  0.20 m MV Vmean:      67.0 cm/s    Systemic Diam: 2.00 cm MV Decel Time: 175 msec MR Peak grad: 20.1 mmHg MR Vmax:      224.00 cm/s MV E velocity: 90.10 cm/s MV A velocity: 121.00 cm/s MV E/A ratio:  0.74 Lamar Fitch MD Electronically signed by Lamar Fitch MD Signature Date/Time: 03/31/2024/1:52:32 PM    Final     CT CORONARY FRACTIONAL FLOW RESERVE FLUID ANALYSIS Result Date: 03/21/2024 EXAM: FFRCT ANALYSIS FINDINGS: FFRct analysis was performed on the original cardiac CT angiogram dataset. Diagrammatic representation of the FFRct analysis is provided in a separate PDF document in PACS. This dictation was created using the PDF document and an interactive 3D model of the results. 3D model is not available in the EMR/PACS. Normal FFR range is >0.80. 1. LM FFR: 1.00 2. LAD FFR: Prox 0.89, mid 0.86, distal - too small for analysis 3. D1 FFR: Prox 0.89, distal 0.74 4. CX FFR: Prox 0.99, mid 0.99, distal 0.72 5.  RCA FFR: Prox 0.99, mid 0.88, distal 0.85 IMPRESSION: This study demonstrates HIGH likelihood of hemodynamically significant stenosis in the distal CX Electronically Signed   By: Lamar Fitch M.D.   On: 03/21/2024 14:25   CT CORONARY MORPH W/CTA COR W/SCORE W/CA W/CM &/OR WO/CM Result Date: 03/21/2024 CLINICAL DATA:  CP EXAM: Cardiac/Coronary  CTA TECHNIQUE: The patient was scanned on a GE Apex scanner. FINDINGS: A 120 kV prospective scan was triggered in the descending thoracic aorta at 111 HU's. Axial non-contrast 3 mm slices were carried out through the heart. The data set was analyzed on a dedicated work station and scored using the Agatson method. Gantry rotation speed was 250 msecs and collimation was .6 mm. No beta blockade and 0.8 mg of sl NTG was given. The 3D data set was reconstructed at 41% of the R-R cycle. Diastolic phases were analyzed on a dedicated work station using MPR, MIP and VRT modes. The patient received 80 cc of contrast. Aorta: Normal size. Atherosclerosis of the descending aorta noted with non calcified plaques. Minimal calcifications. No dissection. Aortic Valve:  Trileaflet.  No calcifications. Coronary Arteries:  Normal coronary origin.  Right dominance. RCA is a large dominant artery that gives rise to PDA and PLA. This artery is diffusely diseased in all segments with numerous  mixed plaques and moderate stenoses of 50-69% at different levels. Left main is a large, short artery that gives rise to LAD and LCX arteries. Calcified plaque is noted with minimal stenosis of 0-24%. LAD is a large vessel is diffusely diseased in its proximal and mid portion with numerous mostly calcified plaques and moderate stenosis of 50-69%. This artery gives rise to large D1. D1 is diffusely diseased in its proximal portion with numerous mostly calcified plaques and moderate stenosis of 50-69% LCX is a non-dominant artery that gives rise to one large OM1 branch. There are numerous mostly calcified plaques in the proximal and mid portion with mild stenosis of 25-49%. Large OM1 has mixed plaques with moderate stenosis of 50-69%. Other findings: Normal pulmonary vein drainage into the left atrium. Normal left atrial appendage without a thrombus. Normal size of the pulmonary artery. IMPRESSION: 1. Coronary calcium  score of 1649. This was 59 percentile for age and sex matched control. 2. Normal coronary origin with right dominance. 3. CAD-RADS 3. Moderate stenosis. (50-69%) LAD, OM1, RCA. Consider symptom-guided anti-ischemic pharmacotherapy as well as risk factor modification per guideline directed care. Additional analysis with CT FFR will be submitted. 4. Technically challenging study secondary to body habitus. Electronically Signed   By: Lamar Fitch M.D.   On: 03/21/2024 14:21    Disposition   Pt is being discharged home today in good condition.  Follow-up Plans & Appointments   2/12 with Dr. Edwyna   Discharge Instructions     Amb Referral to Cardiac Rehabilitation   Complete by: As directed    [place]   Diagnosis: Coronary Stents   After initial evaluation and assessments completed: Virtual Based Care may be provided alone or in conjunction with Phase 2 Cardiac Rehab based on patient barriers.: Yes   Intensive Cardiac Rehabilitation (ICR) [facility] location only OR Traditional Cardiac  Rehabilitation (TCR) *If criteria for ICR are not met will enroll in TCR [facility] only): Yes        Discharge Medications   Allergies as of 04/07/2024       Reactions   Penicillins    Childhood allergy        Medication List  STOP taking these medications    albuterol 108 (90 Base) MCG/ACT inhaler Commonly known as: VENTOLIN HFA   fluticasone 50 MCG/ACT nasal spray Commonly known as: FLONASE   lisinopril 20 MG tablet Commonly known as: ZESTRIL   meclizine 25 MG tablet Commonly known as: ANTIVERT   meloxicam 15 MG tablet Commonly known as: MOBIC       TAKE these medications    aspirin  EC 81 MG tablet Take 1 tablet (81 mg total) by mouth daily. Swallow whole.   atorvastatin  80 MG tablet Commonly known as: LIPITOR Take 80 mg by mouth daily.   clopidogrel  75 MG tablet Commonly known as: Plavix  Take 1 tablet (75 mg total) by mouth daily.   diclofenac 75 MG EC tablet Commonly known as: VOLTAREN Take 75 mg by mouth 2 (two) times daily.   diclofenac Sodium 1 % Gel Commonly known as: VOLTAREN Apply 1 Application topically as needed.   furosemide  40 MG tablet Commonly known as: LASIX  Take 1 tablet (40 mg total) by mouth daily.   gabapentin  300 MG capsule Commonly known as: NEURONTIN  Take 300 mg by mouth 2 (two) times daily.   hydrochlorothiazide  12.5 MG capsule Commonly known as: MICROZIDE  TAKE 1 CAPSULE BY MOUTH EVERY DAY   metoprolol  tartrate 25 MG tablet Commonly known as: LOPRESSOR  Take 25 mg by mouth 2 (two) times daily.   Mounjaro 7.5 MG/0.5ML Pen Generic drug: tirzepatide Inject 7.5 mg into the skin once a week.   nitroGLYCERIN  0.4 MG SL tablet Commonly known as: NITROSTAT  Place 1 tablet (0.4 mg total) under the tongue every 5 (five) minutes as needed for chest pain. What changed: Another medication with the same name was added. Make sure you understand how and when to take each.   nitroGLYCERIN  0.4 MG SL tablet Commonly known as:  NITROSTAT  Place 1 tablet (0.4 mg total) under the tongue every 5 (five) minutes as needed for chest pain. What changed: You were already taking a medication with the same name, and this prescription was added. Make sure you understand how and when to take each.   traMADol 50 MG tablet Commonly known as: ULTRAM Take 50 mg by mouth every 6 (six) hours as needed for moderate pain.           Allergies Allergies[1]  Outstanding Labs/Studies    Duration of Discharge Encounter   Greater than 30 minutes including physician time.  Signed, Thom LITTIE Sluder, PA-C 04/07/2024, 11:57 AM      [1]  Allergies Allergen Reactions   Penicillins     Childhood allergy   "

## 2024-04-08 ENCOUNTER — Encounter (HOSPITAL_COMMUNITY): Payer: Self-pay | Admitting: Cardiovascular Disease

## 2024-04-08 ENCOUNTER — Other Ambulatory Visit (HOSPITAL_COMMUNITY): Payer: Self-pay

## 2024-05-04 ENCOUNTER — Ambulatory Visit: Admitting: Cardiology

## 2024-06-08 ENCOUNTER — Ambulatory Visit (HOSPITAL_COMMUNITY): Admit: 2024-06-08 | Admitting: Orthopedic Surgery

## 2024-06-08 SURGERY — ARTHROPLASTY, KNEE, TOTAL
Anesthesia: Spinal | Site: Knee | Laterality: Right
